# Patient Record
Sex: Female | Born: 1975 | Race: White | Hispanic: No | Smoking: Current every day smoker
Health system: Southern US, Community
[De-identification: ages and names within clinical notes are randomized; demographics above are authoritative.]

## PROBLEM LIST (undated history)

## (undated) DIAGNOSIS — F329 Major depressive disorder, single episode, unspecified: Secondary | ICD-10-CM

## (undated) DIAGNOSIS — F419 Anxiety disorder, unspecified: Secondary | ICD-10-CM

## (undated) DIAGNOSIS — F32A Depression, unspecified: Secondary | ICD-10-CM

## (undated) DIAGNOSIS — R928 Other abnormal and inconclusive findings on diagnostic imaging of breast: Secondary | ICD-10-CM

## (undated) DIAGNOSIS — Z22322 Carrier or suspected carrier of Methicillin resistant Staphylococcus aureus: Secondary | ICD-10-CM

## (undated) DIAGNOSIS — K219 Gastro-esophageal reflux disease without esophagitis: Secondary | ICD-10-CM

## (undated) DIAGNOSIS — S52502A Unspecified fracture of the lower end of left radius, initial encounter for closed fracture: Secondary | ICD-10-CM

## (undated) DIAGNOSIS — A4902 Methicillin resistant Staphylococcus aureus infection, unspecified site: Secondary | ICD-10-CM

## (undated) HISTORY — PX: WISDOM TOOTH EXTRACTION: SHX21

## (undated) HISTORY — DX: Carrier or suspected carrier of methicillin resistant Staphylococcus aureus: Z22.322

---

## 1998-11-28 ENCOUNTER — Emergency Department (HOSPITAL_COMMUNITY): Admission: EM | Admit: 1998-11-28 | Discharge: 1998-11-28 | Payer: Self-pay

## 1999-03-27 ENCOUNTER — Other Ambulatory Visit: Admission: RE | Admit: 1999-03-27 | Discharge: 1999-03-27 | Payer: Self-pay | Admitting: Obstetrics and Gynecology

## 1999-11-06 ENCOUNTER — Other Ambulatory Visit: Admission: RE | Admit: 1999-11-06 | Discharge: 1999-11-06 | Payer: Self-pay | Admitting: Obstetrics and Gynecology

## 2002-07-20 ENCOUNTER — Other Ambulatory Visit: Admission: RE | Admit: 2002-07-20 | Discharge: 2002-07-20 | Payer: Self-pay | Admitting: Obstetrics and Gynecology

## 2003-07-31 ENCOUNTER — Other Ambulatory Visit: Admission: RE | Admit: 2003-07-31 | Discharge: 2003-07-31 | Payer: Self-pay | Admitting: Obstetrics and Gynecology

## 2004-12-23 ENCOUNTER — Other Ambulatory Visit: Admission: RE | Admit: 2004-12-23 | Discharge: 2004-12-23 | Payer: Self-pay | Admitting: Obstetrics and Gynecology

## 2005-05-29 ENCOUNTER — Ambulatory Visit: Payer: Self-pay | Admitting: Internal Medicine

## 2006-01-16 ENCOUNTER — Other Ambulatory Visit: Admission: RE | Admit: 2006-01-16 | Discharge: 2006-01-16 | Payer: Self-pay | Admitting: Obstetrics and Gynecology

## 2006-08-18 ENCOUNTER — Other Ambulatory Visit: Admission: RE | Admit: 2006-08-18 | Discharge: 2006-08-18 | Payer: Self-pay | Admitting: Obstetrics and Gynecology

## 2007-06-03 ENCOUNTER — Ambulatory Visit: Payer: Self-pay | Admitting: Internal Medicine

## 2007-06-03 LAB — CONVERTED CEMR LAB
ALT: 14 units/L (ref 0–40)
AST: 18 units/L (ref 0–37)
Albumin: 3.8 g/dL (ref 3.5–5.2)
Alkaline Phosphatase: 28 units/L — ABNORMAL LOW (ref 39–117)
BUN: 5 mg/dL — ABNORMAL LOW (ref 6–23)
Basophils Absolute: 0 10*3/uL (ref 0.0–0.1)
Basophils Relative: 0.3 % (ref 0.0–1.0)
Bilirubin Urine: NEGATIVE
Bilirubin, Direct: 0.1 mg/dL (ref 0.0–0.3)
CO2: 26 meq/L (ref 19–32)
Calcium: 9 mg/dL (ref 8.4–10.5)
Chloride: 112 meq/L (ref 96–112)
Cholesterol: 233 mg/dL (ref 0–200)
Creatinine, Ser: 0.7 mg/dL (ref 0.4–1.2)
Crystals: NEGATIVE
Direct LDL: 127.4 mg/dL
Eosinophils Absolute: 0.3 10*3/uL (ref 0.0–0.6)
Eosinophils Relative: 4.4 % (ref 0.0–5.0)
GFR calc Af Amer: 126 mL/min
GFR calc non Af Amer: 104 mL/min
Glucose, Bld: 92 mg/dL (ref 70–99)
HCT: 38.9 % (ref 36.0–46.0)
HDL: 64.4 mg/dL (ref >39.0)
Hemoglobin: 13.4 g/dL (ref 12.0–15.0)
Ketones, ur: NEGATIVE mg/dL
Leukocytes, UA: NEGATIVE
Lymphocytes Relative: 46.1 % — ABNORMAL HIGH (ref 12.0–46.0)
MCHC: 34.5 g/dL (ref 30.0–36.0)
MCV: 90.6 fL (ref 78.0–100.0)
Monocytes Absolute: 0.4 10*3/uL (ref 0.2–0.7)
Monocytes Relative: 7.4 % (ref 3.0–11.0)
Neutro Abs: 2.5 10*3/uL (ref 1.4–7.7)
Neutrophils Relative %: 41.8 % — ABNORMAL LOW (ref 43.0–77.0)
Nitrite: NEGATIVE
Platelets: 284 10*3/uL (ref 150–400)
Potassium: 4.2 meq/L (ref 3.5–5.1)
RBC: 4.3 M/uL (ref 3.87–5.11)
RDW: 11.3 % — ABNORMAL LOW (ref 11.5–14.6)
Sodium: 140 meq/L (ref 135–145)
Specific Gravity, Urine: >=1.03 (ref 1.000–1.03)
TSH: 1.75 microintl units/mL (ref 0.35–5.50)
Total Bilirubin: 0.6 mg/dL (ref 0.3–1.2)
Total CHOL/HDL Ratio: 3.6
Total Protein: 6.7 g/dL (ref 6.0–8.3)
Triglycerides: 156 mg/dL — ABNORMAL HIGH (ref 0–149)
Urine Glucose: NEGATIVE mg/dL
Urobilinogen, UA: 0.2 (ref 0.0–1.0)
VLDL: 31 mg/dL (ref 0–40)
WBC: 6 10*3/uL (ref 4.5–10.5)
pH: 6 (ref 5.0–8.0)

## 2007-06-10 ENCOUNTER — Ambulatory Visit: Payer: Self-pay | Admitting: Internal Medicine

## 2007-11-18 ENCOUNTER — Ambulatory Visit: Payer: Self-pay | Admitting: Internal Medicine

## 2007-11-18 DIAGNOSIS — S46819A Strain of other muscles, fascia and tendons at shoulder and upper arm level, unspecified arm, initial encounter: Secondary | ICD-10-CM

## 2007-11-18 DIAGNOSIS — J45909 Unspecified asthma, uncomplicated: Secondary | ICD-10-CM | POA: Insufficient documentation

## 2007-11-18 DIAGNOSIS — F329 Major depressive disorder, single episode, unspecified: Secondary | ICD-10-CM

## 2007-11-18 DIAGNOSIS — E785 Hyperlipidemia, unspecified: Secondary | ICD-10-CM

## 2007-11-18 DIAGNOSIS — K219 Gastro-esophageal reflux disease without esophagitis: Secondary | ICD-10-CM

## 2007-11-18 DIAGNOSIS — S43499A Other sprain of unspecified shoulder joint, initial encounter: Secondary | ICD-10-CM

## 2007-11-18 DIAGNOSIS — J309 Allergic rhinitis, unspecified: Secondary | ICD-10-CM | POA: Insufficient documentation

## 2007-11-26 ENCOUNTER — Telehealth (INDEPENDENT_AMBULATORY_CARE_PROVIDER_SITE_OTHER): Payer: Self-pay | Admitting: *Deleted

## 2007-12-21 ENCOUNTER — Telehealth: Payer: Self-pay | Admitting: Internal Medicine

## 2008-04-03 ENCOUNTER — Ambulatory Visit: Payer: Self-pay | Admitting: Internal Medicine

## 2008-04-03 ENCOUNTER — Encounter (INDEPENDENT_AMBULATORY_CARE_PROVIDER_SITE_OTHER): Payer: Self-pay | Admitting: *Deleted

## 2008-04-03 DIAGNOSIS — J209 Acute bronchitis, unspecified: Secondary | ICD-10-CM

## 2008-04-07 ENCOUNTER — Telehealth (INDEPENDENT_AMBULATORY_CARE_PROVIDER_SITE_OTHER): Payer: Self-pay | Admitting: *Deleted

## 2008-04-18 ENCOUNTER — Ambulatory Visit: Payer: Self-pay | Admitting: Internal Medicine

## 2008-04-18 LAB — CONVERTED CEMR LAB
Basophils Absolute: 0.1 10*3/uL (ref 0.0–0.1)
Basophils Relative: 1 % (ref 0.0–1.0)
Eosinophils Absolute: 0.4 10*3/uL (ref 0.0–0.7)
Eosinophils Relative: 5.4 % — ABNORMAL HIGH (ref 0.0–5.0)
HCT: 39.4 % (ref 36.0–46.0)
Hemoglobin: 13.7 g/dL (ref 12.0–15.0)
IgE (Immunoglobulin E), Serum: 171.5 intl units/mL (ref 0.0–180.0)
Lymphocytes Relative: 38.6 % (ref 12.0–46.0)
MCHC: 34.7 g/dL (ref 30.0–36.0)
MCV: 91.2 fL (ref 78.0–100.0)
Monocytes Absolute: 0.4 10*3/uL (ref 0.1–1.0)
Monocytes Relative: 5 % (ref 3.0–12.0)
Neutro Abs: 3.5 10*3/uL (ref 1.4–7.7)
Neutrophils Relative %: 50 % (ref 43.0–77.0)
Platelets: 287 10*3/uL (ref 150–400)
RBC: 4.32 M/uL (ref 3.87–5.11)
RDW: 11.4 % — ABNORMAL LOW (ref 11.5–14.6)
WBC: 7.1 10*3/uL (ref 4.5–10.5)

## 2008-04-23 DIAGNOSIS — I10 Essential (primary) hypertension: Secondary | ICD-10-CM | POA: Insufficient documentation

## 2008-05-22 ENCOUNTER — Encounter: Payer: Self-pay | Admitting: Internal Medicine

## 2008-05-22 ENCOUNTER — Ambulatory Visit: Payer: Self-pay | Admitting: Internal Medicine

## 2009-10-29 ENCOUNTER — Telehealth: Payer: Self-pay | Admitting: Internal Medicine

## 2010-12-15 NOTE — L&D Delivery Note (Signed)
Delivery Note Pt progressed to complete and pushed well.  At 8:43 PM a viable and healthy female was delivered via Vaginal, Spontaneous Delivery (Presentation: Left Occiput Anterior).  APGAR: 9, 9; weight 7 lb 7 oz (3374 g).   Placenta status: Intact, Spontaneous.  Cord: 3 vessels with the following complications: None.  Anesthesia: Epidural  Episiotomy: None Lacerations: 2nd degree;Perineal Suture Repair: 3.0 vicryl Est. Blood Loss (mL): 400  Mom to postpartum.  Baby to nursery-stable.  Stephaun Million D 10/14/2011, 9:20 PM

## 2011-02-18 ENCOUNTER — Ambulatory Visit: Payer: Self-pay | Admitting: Internal Medicine

## 2011-03-28 LAB — ABO/RH: RH Type: POSITIVE

## 2011-03-28 LAB — ANTIBODY SCREEN: Antibody Screen: NEGATIVE

## 2011-03-28 LAB — HEPATITIS B SURFACE ANTIGEN: Hepatitis B Surface Ag: NEGATIVE

## 2011-05-27 DIAGNOSIS — J45909 Unspecified asthma, uncomplicated: Secondary | ICD-10-CM

## 2011-06-26 ENCOUNTER — Other Ambulatory Visit (HOSPITAL_COMMUNITY): Payer: Self-pay | Admitting: Obstetrics and Gynecology

## 2011-06-26 DIAGNOSIS — Z3689 Encounter for other specified antenatal screening: Secondary | ICD-10-CM

## 2011-07-03 ENCOUNTER — Encounter (HOSPITAL_COMMUNITY): Payer: Self-pay

## 2011-07-04 ENCOUNTER — Ambulatory Visit (HOSPITAL_COMMUNITY)
Admission: RE | Admit: 2011-07-04 | Discharge: 2011-07-04 | Disposition: A | Discharge status: Home / Self Care | Payer: Medicaid Other | Source: Ambulatory Visit | Attending: Obstetrics and Gynecology | Admitting: Obstetrics and Gynecology

## 2011-07-04 DIAGNOSIS — O09519 Supervision of elderly primigravida, unspecified trimester: Secondary | ICD-10-CM | POA: Insufficient documentation

## 2011-07-04 DIAGNOSIS — O358XX0 Maternal care for other (suspected) fetal abnormality and damage, not applicable or unspecified: Secondary | ICD-10-CM | POA: Insufficient documentation

## 2011-07-04 DIAGNOSIS — Z3689 Encounter for other specified antenatal screening: Secondary | ICD-10-CM

## 2011-07-06 ENCOUNTER — Inpatient Hospital Stay (HOSPITAL_COMMUNITY)
Admission: AD | Admit: 2011-07-06 | Discharge: 2011-07-07 | Disposition: A | Discharge status: Home Health | Payer: Medicaid Other | Source: Ambulatory Visit | Attending: Obstetrics and Gynecology | Admitting: Obstetrics and Gynecology

## 2011-07-06 DIAGNOSIS — Z3490 Encounter for supervision of normal pregnancy, unspecified, unspecified trimester: Secondary | ICD-10-CM

## 2011-07-06 DIAGNOSIS — O9989 Other specified diseases and conditions complicating pregnancy, childbirth and the puerperium: Secondary | ICD-10-CM | POA: Insufficient documentation

## 2011-07-06 DIAGNOSIS — R0602 Shortness of breath: Secondary | ICD-10-CM | POA: Insufficient documentation

## 2011-07-06 DIAGNOSIS — J45901 Unspecified asthma with (acute) exacerbation: Secondary | ICD-10-CM | POA: Insufficient documentation

## 2011-07-07 ENCOUNTER — Encounter (HOSPITAL_COMMUNITY): Payer: Self-pay | Admitting: Advanced Practice Midwife

## 2011-07-07 MED ORDER — NEBULIZER/TUBING/MOUTHPIECE KIT: PACK | Status: DC

## 2011-07-07 MED ORDER — IPRATROPIUM BROMIDE 0.02 % IN SOLN
RESPIRATORY_TRACT | Status: AC
  Filled 2011-07-07: qty 2.5

## 2011-07-07 MED ORDER — LEVALBUTEROL HCL 0.63 MG/3ML IN NEBU: 1.0000 | INHALATION_SOLUTION | RESPIRATORY_TRACT | Status: DC | PRN

## 2011-07-07 MED ORDER — ALBUTEROL SULFATE (5 MG/ML) 0.5% IN NEBU
INHALATION_SOLUTION | RESPIRATORY_TRACT | Status: AC
  Filled 2011-07-07: qty 0.5

## 2011-07-07 MED ORDER — ALBUTEROL SULFATE (5 MG/ML) 0.5% IN NEBU
5.0000 mg | INHALATION_SOLUTION | RESPIRATORY_TRACT | Status: DC
  Filled 2011-07-07 (×9): qty 1

## 2011-07-07 MED ORDER — IPRATROPIUM BROMIDE 0.02 % IN SOLN: 0.5000 mg | RESPIRATORY_TRACT | Status: DC

## 2011-07-07 MED ORDER — LEVALBUTEROL TARTRATE 45 MCG/ACT IN AERO: 1.0000 | INHALATION_SPRAY | RESPIRATORY_TRACT | Status: DC | PRN

## 2011-07-07 NOTE — Progress Notes (Signed)
Pt discharged for home-2nd reviewer of FHT tracing-N.Frazier,CNM

## 2011-07-07 NOTE — Progress Notes (Signed)
Doing some home remodeling and asthma has caused sob, tightness in chest.  Came in for a breathing treatment.  [redacted]wks pregnant. g1p0

## 2011-07-07 NOTE — ED Provider Notes (Signed)
History     Chief Complaint  Patient presents with  . Shortness of Breath   HPI 35 y.o.G1P0 at [redacted]w[redacted]d c/o asthma attack at home tonight. Husband is doing some renovations and stirred up a lot of dust today. + h/o asthma. Had episode of severe SOB this evening, does not have inhaler at home, has been a long time since last asthma attack. Started feeling better on the way here, no SOB or wheezing now. + fetal movement.     Past Medical History  Diagnosis Date  . Asthma     No past surgical history on file.  No family history on file.  History  Substance Use Topics  . Smoking status: Former Games developer  . Smokeless tobacco: Not on file  . Alcohol Use:     Allergies:  Allergies  Allergen Reactions  . Carrot Flavor Other (See Comments)    unknown    No prescriptions prior to admission    Review of Systems  HENT: Negative.   Respiratory: Positive for shortness of breath (now resolved) and wheezing (now resolved).   Cardiovascular: Negative.   Gastrointestinal: Negative.   Genitourinary: Negative.   Musculoskeletal: Negative.   Neurological: Negative.   Psychiatric/Behavioral: Negative.    Physical Exam   Blood pressure 116/70, pulse 88, temperature 98.1 F (36.7 C), temperature source Oral, resp. rate 20, height 5\' 4"  (1.626 m), weight 93.985 kg (207 lb 3.2 oz), last menstrual period 01/15/2011, SpO2 98.00%.  Physical Exam  Nursing note and vitals reviewed. Constitutional: She is oriented to person, place, and time. She appears well-developed and well-nourished. No distress.  Cardiovascular: Normal rate, regular rhythm and normal heart sounds.   Respiratory: Effort normal and breath sounds normal. No respiratory distress. She has no wheezes. She has no rales.  Musculoskeletal: Normal range of motion.  Neurological: She is alert and oriented to person, place, and time.  Skin: Skin is warm and dry.  Psychiatric: She has a normal mood and affect.    MAU Course    Procedures  Dr. Ellyn Hack aware pt was coming to MAU, ordered breathing treatment and rx for inhaler for home. However, upon arrival, pt states sx have improved, lungs clear. Declines breathing treatment. Monitored, pt stable, plan to d/c home with rx for inhaler and albuterol nebs PRN.   Assessment and Plan  35 y.o.G1P0 at [redacted]w[redacted]d with asthma exacerbation Rx Xopenex inhaler and nebulizer, instructed patient on use Pt to return if asthma sx return and does not get relief with meds   FRAZIER,NATALIE 07/07/2011, 2:58 AM

## 2011-07-07 NOTE — Progress Notes (Signed)
Pt relaxed with no signs of resp distress-states the SOB has resolved now that she is away from the house-they are remodling and the dust set her off

## 2011-10-14 ENCOUNTER — Encounter (HOSPITAL_COMMUNITY): Payer: Self-pay | Admitting: *Deleted

## 2011-10-14 ENCOUNTER — Inpatient Hospital Stay (HOSPITAL_COMMUNITY)
Admission: AD | Admit: 2011-10-14 | Discharge: 2011-10-16 | DRG: 775 | Disposition: A | Discharge status: Home / Self Care | Payer: Medicaid Other | Source: Ambulatory Visit | Attending: Obstetrics and Gynecology | Admitting: Obstetrics and Gynecology

## 2011-10-14 ENCOUNTER — Inpatient Hospital Stay (HOSPITAL_COMMUNITY): Payer: Medicaid Other | Admitting: Anesthesiology

## 2011-10-14 ENCOUNTER — Encounter (HOSPITAL_COMMUNITY): Payer: Self-pay | Admitting: Anesthesiology

## 2011-10-14 ENCOUNTER — Other Ambulatory Visit: Payer: Self-pay | Admitting: Obstetrics and Gynecology

## 2011-10-14 LAB — CBC
Hemoglobin: 12.3 g/dL (ref 12.0–15.0)
Platelets: 245 10*3/uL (ref 150–400)
RBC: 4.16 MIL/uL (ref 3.87–5.11)
WBC: 9.1 10*3/uL (ref 4.0–10.5)

## 2011-10-14 LAB — RPR: RPR Ser Ql: NONREACTIVE

## 2011-10-14 MED ORDER — ONDANSETRON HCL 4 MG/2ML IJ SOLN: 4.0000 mg | Freq: Four times a day (QID) | INTRAMUSCULAR | Status: DC | PRN

## 2011-10-14 MED ORDER — IBUPROFEN 600 MG PO TABS
600.0000 mg | ORAL_TABLET | Freq: Four times a day (QID) | ORAL | Status: DC | PRN
  Administered 2011-10-14: 600 mg via ORAL
  Filled 2011-10-14: qty 1

## 2011-10-14 MED ORDER — LIDOCAINE HCL 1.5 % IJ SOLN
INTRAMUSCULAR | Status: DC | PRN
  Administered 2011-10-14 (×2): 5 mL via EPIDURAL

## 2011-10-14 MED ORDER — FENTANYL 2.5 MCG/ML BUPIVACAINE 1/10 % EPIDURAL INFUSION (WH - ANES)
14.0000 mL/h | INTRAMUSCULAR | Status: DC
  Administered 2011-10-14: 14 mL/h via EPIDURAL
  Filled 2011-10-14 (×2): qty 60

## 2011-10-14 MED ORDER — FENTANYL 2.5 MCG/ML BUPIVACAINE 1/10 % EPIDURAL INFUSION (WH - ANES)
INTRAMUSCULAR | Status: DC | PRN
  Administered 2011-10-14: 14 mL/h via EPIDURAL

## 2011-10-14 MED ORDER — LACTATED RINGERS IV SOLN: 500.0000 mL | Freq: Once | INTRAVENOUS | Status: DC

## 2011-10-14 MED ORDER — EPHEDRINE 5 MG/ML INJ
10.0000 mg | INTRAVENOUS | Status: DC | PRN
  Filled 2011-10-14 (×2): qty 4

## 2011-10-14 MED ORDER — OXYTOCIN 20 UNITS IN LACTATED RINGERS INFUSION - SIMPLE
125.0000 mL/h | Freq: Once | INTRAVENOUS | Status: AC
  Administered 2011-10-14: 125 mL/h via INTRAVENOUS

## 2011-10-14 MED ORDER — OXYCODONE-ACETAMINOPHEN 5-325 MG PO TABS: 2.0000 | ORAL_TABLET | ORAL | Status: DC | PRN

## 2011-10-14 MED ORDER — PENICILLIN G POTASSIUM 5000000 UNITS IJ SOLR
2.5000 10*6.[IU] | INTRAVENOUS | Status: DC
  Administered 2011-10-14 (×2): 2.5 10*6.[IU] via INTRAVENOUS
  Filled 2011-10-14 (×7): qty 2.5

## 2011-10-14 MED ORDER — BUTORPHANOL TARTRATE 2 MG/ML IJ SOLN
INTRAMUSCULAR | Status: AC
  Administered 2011-10-14: 1 mg via INTRAVENOUS
  Filled 2011-10-14: qty 1

## 2011-10-14 MED ORDER — TERBUTALINE SULFATE 1 MG/ML IJ SOLN: 0.2500 mg | Freq: Once | INTRAMUSCULAR | Status: AC | PRN

## 2011-10-14 MED ORDER — LIDOCAINE HCL (PF) 1 % IJ SOLN
30.0000 mL | INTRAMUSCULAR | Status: DC | PRN
  Administered 2011-10-14: 30 mL via SUBCUTANEOUS
  Filled 2011-10-14: qty 30

## 2011-10-14 MED ORDER — CITRIC ACID-SODIUM CITRATE 334-500 MG/5ML PO SOLN: 30.0000 mL | ORAL | Status: DC | PRN

## 2011-10-14 MED ORDER — LACTATED RINGERS IV SOLN: 500.0000 mL | INTRAVENOUS | Status: DC | PRN

## 2011-10-14 MED ORDER — PENICILLIN G POTASSIUM 5000000 UNITS IJ SOLR
5.0000 10*6.[IU] | Freq: Once | INTRAVENOUS | Status: AC
  Administered 2011-10-14: 5 10*6.[IU] via INTRAVENOUS
  Filled 2011-10-14: qty 5

## 2011-10-14 MED ORDER — EPHEDRINE 5 MG/ML INJ
10.0000 mg | INTRAVENOUS | Status: DC | PRN
  Filled 2011-10-14: qty 4

## 2011-10-14 MED ORDER — PHENYLEPHRINE 40 MCG/ML (10ML) SYRINGE FOR IV PUSH (FOR BLOOD PRESSURE SUPPORT)
80.0000 ug | PREFILLED_SYRINGE | INTRAVENOUS | Status: DC | PRN
  Filled 2011-10-14: qty 5

## 2011-10-14 MED ORDER — ACETAMINOPHEN 325 MG PO TABS: 650.0000 mg | ORAL_TABLET | ORAL | Status: DC | PRN

## 2011-10-14 MED ORDER — BUTORPHANOL TARTRATE 2 MG/ML IJ SOLN
1.0000 mg | Freq: Once | INTRAMUSCULAR | Status: AC
  Administered 2011-10-14: 1 mg via INTRAVENOUS

## 2011-10-14 MED ORDER — LACTATED RINGERS IV SOLN
INTRAVENOUS | Status: DC
  Administered 2011-10-14 (×3): via INTRAVENOUS

## 2011-10-14 MED ORDER — OXYTOCIN 20 UNITS IN LACTATED RINGERS INFUSION - SIMPLE
1.0000 m[IU]/min | INTRAVENOUS | Status: DC
  Administered 2011-10-14: 1 m[IU]/min via INTRAVENOUS
  Filled 2011-10-14 (×2): qty 1000

## 2011-10-14 MED ORDER — PHENYLEPHRINE 40 MCG/ML (10ML) SYRINGE FOR IV PUSH (FOR BLOOD PRESSURE SUPPORT)
80.0000 ug | PREFILLED_SYRINGE | INTRAVENOUS | Status: DC | PRN
  Filled 2011-10-14 (×2): qty 5

## 2011-10-14 MED ORDER — OXYTOCIN BOLUS FROM INFUSION
500.0000 mL | Freq: Once | INTRAVENOUS | Status: DC
  Filled 2011-10-14: qty 500

## 2011-10-14 MED ORDER — DIPHENHYDRAMINE HCL 50 MG/ML IJ SOLN: 12.5000 mg | INTRAMUSCULAR | Status: DC | PRN

## 2011-10-14 NOTE — Anesthesia Preprocedure Evaluation (Signed)
Anesthesia Evaluation  Patient identified by MRN, date of birth, ID band Patient awake  General Assessment Comment  Reviewed: Allergy & Precautions, H&P , NPO status , Patient's Chart, lab work & pertinent test results  Airway Mallampati: II TM Distance: >3 FB Neck ROM: full    Dental No notable dental hx.    Pulmonary asthma  clear to auscultation  Pulmonary exam normal       Cardiovascular hypertension,     Neuro/Psych Negative Neurological ROS  Negative Psych ROS   GI/Hepatic negative GI ROS Neg liver ROS  GERD Medicated and Controlled  Endo/Other  Negative Endocrine ROS  Renal/GU negative Renal ROS  Genitourinary negative   Musculoskeletal negative musculoskeletal ROS (+)   Abdominal Normal abdominal exam  (+)   Peds negative pediatric ROS (+)  Hematology negative hematology ROS (+)   Anesthesia Other Findings   Reproductive/Obstetrics (+) Pregnancy                           Anesthesia Physical Anesthesia Plan  ASA: II  Anesthesia Plan: Epidural   Post-op Pain Management:    Induction:   Airway Management Planned:   Additional Equipment:   Intra-op Plan:   Post-operative Plan:   Informed Consent: I have reviewed the patients History and Physical, chart, labs and discussed the procedure including the risks, benefits and alternatives for the proposed anesthesia with the patient or authorized representative who has indicated his/her understanding and acceptance.     Plan Discussed with:   Anesthesia Plan Comments:         Anesthesia Quick Evaluation

## 2011-10-14 NOTE — Anesthesia Procedure Notes (Signed)
Epidural Patient location during procedure: OB Start time: 10/14/2011 3:23 PM End time: 10/14/2011 3:31 PM Reason for block: procedure for pain  Staffing Anesthesiologist: Sandrea Hughs Performed by: anesthesiologist   Preanesthetic Checklist Completed: patient identified, site marked, surgical consent, pre-op evaluation, timeout performed, IV checked, risks and benefits discussed and monitors and equipment checked  Epidural Patient position: sitting Prep: site prepped and draped and DuraPrep Patient monitoring: continuous pulse ox and blood pressure Approach: midline Injection technique: LOR air  Needle:  Needle type: Tuohy  Needle gauge: 17 G Needle length: 9 cm Needle insertion depth: 7 cm Catheter type: closed end flexible Catheter size: 19 Gauge Catheter at skin depth: 12 cm Test dose: negative and 1.5% lidocaine  Assessment Sensory level: T10 Events: blood not aspirated, injection not painful, no injection resistance, negative IV test and no paresthesia

## 2011-10-14 NOTE — H&P (Signed)
Karen Michael is a 35 y.o. female, G1 P0, EGA 38+ weeks, seen in the office this am c/o leaking fluid off amd on since between 2-4 am today, irregular ctx.  Eval in office c/w ROM and early labor, admitted to L&D.  Prenatal care essentially uncomplicated, see prenatal records for complete history.   Maternal Medical History:  Reason for admission: Reason for admission: rupture of membranes.  Contractions: Frequency: irregular.    Fetal activity: Perceived fetal activity is decreased.    Prenatal complications: no prenatal complications   OB History    Grav Para Term Preterm Abortions TAB SAB Ect Mult Living   1              Past Medical History  Diagnosis Date  . Asthma     last used inhaler this summer   Past Surgical History  Procedure Date  . Wisdom tooth extraction    Family History: family history is not on file. Social History:  reports that she has quit smoking. She does not have any smokeless tobacco history on file. She reports that she does not drink alcohol or use illicit drugs.  Review of Systems  Respiratory: Negative.   Cardiovascular: Negative.     Dilation: 1 Effacement (%): 90 Station: -2 Exam by:: L. Lima, RN Blood pressure 118/79, pulse 73, temperature 98 F (36.7 C), temperature source Oral, resp. rate 20, height 5\' 5"  (1.651 m), weight 95.709 kg (211 lb), last menstrual period 01/15/2011. Maternal Exam:  Uterine Assessment: Contraction strength is moderate.  Contraction frequency is irregular.   Abdomen: Patient reports no abdominal tenderness. Estimated fetal weight is 8 lbs.   Fetal presentation: vertex  Introitus: Normal vulva. Normal vagina.    Fetal Exam Fetal Monitor Review: Mode: ultrasound.   Baseline rate: 130.  Variability: moderate (6-25 bpm).   Pattern: accelerations present and no decelerations.    Fetal State Assessment: Category I - tracings are normal.     Physical Exam  Constitutional: She appears well-developed and  well-nourished.  Neck: Neck supple. No thyromegaly present.  Cardiovascular: Normal rate, regular rhythm and normal heart sounds.   No murmur heard. Respiratory: Breath sounds normal. No respiratory distress. She has no wheezes.  GI: Soft.    Prenatal labs: ABO, Rh: A/Positive/-- (04/13 0000) Antibody: Negative (04/13 0000) Rubella: Immune (04/13 0000) RPR: Nonreactive (04/13 0000)  HBsAg: Negative (04/13 0000)  HIV: Non-reactive (04/13 0000)  GBS: Positive (10/30 0000)   Assessment/Plan: IUP at 38+ weeks with SROM in early labor, GBS pos.  On pitocin augmentation, on PCN for GBS, monitor progress.   Karen Michael D 10/14/2011, 1:04 PM

## 2011-10-14 NOTE — Progress Notes (Signed)
Comfortable with epidural Afeb, VSS FHT- Cat I, ctx q 3 min VE 7cm per RN Continue pitocin and PCN, monitor progress

## 2011-10-15 MED ORDER — WITCH HAZEL-GLYCERIN EX PADS: 1.0000 "application " | MEDICATED_PAD | CUTANEOUS | Status: DC | PRN

## 2011-10-15 MED ORDER — MAGNESIUM HYDROXIDE 400 MG/5ML PO SUSP: 30.0000 mL | ORAL | Status: DC | PRN

## 2011-10-15 MED ORDER — DIPHENHYDRAMINE HCL 25 MG PO CAPS: 50.0000 mg | ORAL_CAPSULE | Freq: Every evening | ORAL | Status: DC | PRN

## 2011-10-15 MED ORDER — BENZOCAINE-MENTHOL 20-0.5 % EX AERO: 1.0000 "application " | INHALATION_SPRAY | CUTANEOUS | Status: DC | PRN

## 2011-10-15 MED ORDER — LANOLIN HYDROUS EX OINT: TOPICAL_OINTMENT | CUTANEOUS | Status: DC | PRN

## 2011-10-15 MED ORDER — IBUPROFEN 600 MG PO TABS
600.0000 mg | ORAL_TABLET | Freq: Four times a day (QID) | ORAL | Status: DC
  Administered 2011-10-15 – 2011-10-16 (×5): 600 mg via ORAL
  Filled 2011-10-15 (×5): qty 1

## 2011-10-15 MED ORDER — ONDANSETRON HCL 4 MG/2ML IJ SOLN: 4.0000 mg | INTRAMUSCULAR | Status: DC | PRN

## 2011-10-15 MED ORDER — LEVALBUTEROL TARTRATE 45 MCG/ACT IN AERO: 1.0000 | INHALATION_SPRAY | Freq: Three times a day (TID) | RESPIRATORY_TRACT | Status: DC | PRN

## 2011-10-15 MED ORDER — DIBUCAINE 1 % RE OINT: 1.0000 "application " | TOPICAL_OINTMENT | RECTAL | Status: DC | PRN

## 2011-10-15 MED ORDER — MEASLES, MUMPS & RUBELLA VAC ~~LOC~~ INJ: 0.5000 mL | INJECTION | Freq: Once | SUBCUTANEOUS | Status: DC

## 2011-10-15 MED ORDER — ONDANSETRON HCL 4 MG PO TABS: 4.0000 mg | ORAL_TABLET | ORAL | Status: DC | PRN

## 2011-10-15 MED ORDER — OXYCODONE-ACETAMINOPHEN 5-325 MG PO TABS
1.0000 | ORAL_TABLET | ORAL | Status: DC | PRN
  Administered 2011-10-15 – 2011-10-16 (×2): 1 via ORAL
  Filled 2011-10-15 (×2): qty 1

## 2011-10-15 MED ORDER — TETANUS-DIPHTH-ACELL PERTUSSIS 5-2.5-18.5 LF-MCG/0.5 IM SUSP
0.5000 mL | Freq: Once | INTRAMUSCULAR | Status: DC
  Filled 2011-10-15: qty 0.5

## 2011-10-15 MED ORDER — PRENATAL PLUS 27-1 MG PO TABS
1.0000 | ORAL_TABLET | Freq: Every day | ORAL | Status: DC
  Administered 2011-10-15 – 2011-10-16 (×2): 1 via ORAL
  Filled 2011-10-15 (×2): qty 1

## 2011-10-15 MED ORDER — SENNOSIDES-DOCUSATE SODIUM 8.6-50 MG PO TABS
2.0000 | ORAL_TABLET | Freq: Every day | ORAL | Status: DC
  Administered 2011-10-16: 2 via ORAL

## 2011-10-15 MED ORDER — OXYTOCIN 20 UNITS IN LACTATED RINGERS INFUSION - SIMPLE: 125.0000 mL/h | INTRAVENOUS | Status: DC | PRN

## 2011-10-15 MED ORDER — METHYLERGONOVINE MALEATE 0.2 MG PO TABS
0.2000 mg | ORAL_TABLET | ORAL | Status: DC | PRN
Start: 2011-10-15 — End: 2011-10-16

## 2011-10-15 MED ORDER — LORATADINE 10 MG PO TABS
10.0000 mg | ORAL_TABLET | Freq: Every day | ORAL | Status: DC
  Filled 2011-10-15 (×2): qty 1

## 2011-10-15 MED ORDER — SIMETHICONE 80 MG PO CHEW: 80.0000 mg | CHEWABLE_TABLET | ORAL | Status: DC | PRN

## 2011-10-15 MED ORDER — METHYLERGONOVINE MALEATE 0.2 MG/ML IJ SOLN: 0.2000 mg | INTRAMUSCULAR | Status: DC | PRN

## 2011-10-15 MED ORDER — ZOLPIDEM TARTRATE 5 MG PO TABS: 5.0000 mg | ORAL_TABLET | Freq: Every evening | ORAL | Status: DC | PRN

## 2011-10-15 NOTE — Progress Notes (Signed)

## 2011-10-15 NOTE — Addendum Note (Signed)
Addendum  created 10/15/11 1331 by Suella Grove   Modules edited:Charges VN, Notes Section

## 2011-10-15 NOTE — Progress Notes (Signed)
PPD#1 No problems Afeb, VSS Fundus- firm, NT at U-1 Continue routine care

## 2011-10-15 NOTE — Anesthesia Postprocedure Evaluation (Signed)
  Anesthesia Post-op Note  Patient: Karen Michael  Procedure(s) Performed: * No procedures listed *  Patient Location: PACU and Mother/Baby  Anesthesia Type: Epidural  Level of Consciousness: awake, alert  and oriented  Airway and Oxygen Therapy: Patient Spontanous Breathing  Post-op Pain: mild  Post-op Assessment: Post-op Vital signs reviewed  Post-op Vital Signs: Reviewed and stable  Complications: No apparent anesthesia complications

## 2011-10-15 NOTE — Progress Notes (Signed)
UR chart review completed.  

## 2011-10-15 NOTE — Anesthesia Postprocedure Evaluation (Signed)
Anesthesia Post Note  Patient: Karen Michael  Procedure(s) Performed: * No procedures listed *  Anesthesia type: Epidural  Patient location: Mother/Baby  Post pain: Pain level controlled  Post assessment: Post-op Vital signs reviewed  Last Vitals:  Filed Vitals:   10/15/11 0435  BP: 108/72  Pulse: 78  Temp: 36.7 C  Resp: 18    Post vital signs: Reviewed  Level of consciousness: awake  Complications: No apparent anesthesia complications

## 2011-10-16 MED ORDER — IBUPROFEN 600 MG PO TABS: 600.0000 mg | ORAL_TABLET | Freq: Four times a day (QID) | ORAL | Status: AC

## 2011-10-16 MED ORDER — OXYCODONE-ACETAMINOPHEN 5-325 MG PO TABS: 1.0000 | ORAL_TABLET | ORAL | Status: AC | PRN

## 2011-10-16 NOTE — Progress Notes (Signed)
PPD #2 Doing well Afeb, VSS D/c home 

## 2011-10-16 NOTE — Discharge Summary (Signed)
Obstetric Discharge Summary Reason for Admission: rupture of membranes Prenatal Procedures: none Intrapartum Procedures: spontaneous vaginal delivery Postpartum Procedures: none Complications-Operative and Postpartum: 2nd degree perineal laceration Hemoglobin  Date Value Range Status  10/14/2011 12.3  12.0-15.0 (g/dL) Final     HCT  Date Value Range Status  10/14/2011 36.3  36.0-46.0 (%) Final    Discharge Diagnoses: Term Pregnancy-delivered  Discharge Information: Date: 10/16/2011 Activity: pelvic rest Diet: routine Medications: Ibuprofen and Percocet Condition: stable Instructions: refer to practice specific booklet Discharge to: home Follow-up Information    Follow up with Zoran Yankee D, MD. Make an appointment in 6 weeks.   Contact information:   [address], [address] [place] [address] [phone]          Newborn Data: Live born female  Birth Weight: 7 lb 7 oz (3374 g) APGAR: 9, 9  Home with mother.  Eilan Mcinerny D 10/16/2011, 8:05 AM

## 2011-10-21 ENCOUNTER — Ambulatory Visit (HOSPITAL_COMMUNITY)
Admit: 2011-10-21 | Discharge: 2011-10-21 | Disposition: A | Discharge status: Home / Self Care | Payer: Medicaid Other | Attending: Obstetrics and Gynecology | Admitting: Obstetrics and Gynecology

## 2011-10-21 NOTE — Progress Notes (Signed)
Infant Lactation Consultation Outpatient Visit Note  Patient Name: Karen Michael Date of Birth: 02/22/1976 Birth Weight:  7 lbs 7 oz  Gestational Age at Delivery: Gestational Age: <None>38 6/7 weeks Type of Delivery: Vaginal  Breastfeeding History Frequency of Breastfeeding: every 3 hrs. Length of Feeding: 20 mins per breast Voids:  Stools: changing to yellow, liquid and >3 per day   Comments: Baby initially lost 13 oz (>10%) 6 lbs 10 oz on day 4, compounded with jaundice (bili 14).  Without any supplementation, baby increased to 6 lbs 15 oz after milk in.  Mom using a medium (24mm) nipple shield which was initiated in the hospital for difficult latch/soreness.   Consultation Evaluation:  Initial Feeding Assessment: Pre-feed Weight: 3232 gm Post-feed Weight: 3246 gm Amount Transferred: 14 ml from left breast (without nipple shield)  Comments:  with help attaining a deep latch.  Baby fed 20 mins.   Additional Feeding Assessment: Pre-feed Weight: 3246 gm Post-feed Weight: 3280 gm Amount Transferred: 34 ml from right breast (with nipple shield) Comments:  Baby latches very well, and deeply with the use of the nipple shield.   Total Breast milk Transferred this Visit: 48 mls   Additional Interventions: Talked and reassured mother about using the nipple shield.  Explained and demonstrated how her nipple inverts slightly when breast shaped for latch.  This makes is difficult for baby to latch deep enough.  Breasts are large, heavy, and dense, and as baby grows and gets stronger, she will be more likely to be able to latch adequately. Recommended she watch the baby for nutritive nursing rather than the clock for when to switch to second breast.  We also talked about waking baby for a feeding after 3 hours, but letting her feed more often if she shows feeding cues.  She slept 8 hrs a couple nights ago, and we talked about the affects of jaundice and breastfeeding.  Follow-Up  To  call us prn for any questions or for another feeding assessment    [name] 10/21/2011, 3:00 PM

## 2012-02-06 ENCOUNTER — Ambulatory Visit: Payer: Medicaid Other | Admitting: Physical Therapy

## 2012-02-11 ENCOUNTER — Ambulatory Visit: Payer: Medicaid Other | Attending: Orthopaedic Surgery | Admitting: Rehabilitative and Restorative Service Providers"

## 2012-02-11 DIAGNOSIS — M25569 Pain in unspecified knee: Secondary | ICD-10-CM | POA: Insufficient documentation

## 2012-02-11 DIAGNOSIS — M25579 Pain in unspecified ankle and joints of unspecified foot: Secondary | ICD-10-CM | POA: Insufficient documentation

## 2012-02-11 DIAGNOSIS — IMO0001 Reserved for inherently not codable concepts without codable children: Secondary | ICD-10-CM | POA: Insufficient documentation

## 2012-02-20 ENCOUNTER — Ambulatory Visit: Payer: Medicaid Other | Attending: Orthopaedic Surgery | Admitting: Physical Therapy

## 2012-02-20 DIAGNOSIS — IMO0001 Reserved for inherently not codable concepts without codable children: Secondary | ICD-10-CM | POA: Insufficient documentation

## 2012-02-20 DIAGNOSIS — M25569 Pain in unspecified knee: Secondary | ICD-10-CM | POA: Insufficient documentation

## 2012-02-20 DIAGNOSIS — M25579 Pain in unspecified ankle and joints of unspecified foot: Secondary | ICD-10-CM | POA: Insufficient documentation

## 2012-02-27 ENCOUNTER — Encounter: Payer: Medicaid Other | Admitting: Physical Therapy

## 2012-03-05 ENCOUNTER — Ambulatory Visit: Payer: Medicaid Other | Admitting: Physical Therapy

## 2012-03-16 ENCOUNTER — Ambulatory Visit: Payer: Medicaid Other | Attending: Orthopaedic Surgery | Admitting: Rehabilitative and Restorative Service Providers"

## 2012-03-16 DIAGNOSIS — M25579 Pain in unspecified ankle and joints of unspecified foot: Secondary | ICD-10-CM | POA: Insufficient documentation

## 2012-03-16 DIAGNOSIS — IMO0001 Reserved for inherently not codable concepts without codable children: Secondary | ICD-10-CM | POA: Insufficient documentation

## 2012-03-16 DIAGNOSIS — M25569 Pain in unspecified knee: Secondary | ICD-10-CM | POA: Insufficient documentation

## 2014-10-16 ENCOUNTER — Encounter (HOSPITAL_COMMUNITY): Payer: Self-pay | Admitting: *Deleted

## 2016-02-21 ENCOUNTER — Other Ambulatory Visit: Payer: Self-pay | Admitting: Obstetrics and Gynecology

## 2016-02-21 DIAGNOSIS — R928 Other abnormal and inconclusive findings on diagnostic imaging of breast: Secondary | ICD-10-CM

## 2016-02-28 ENCOUNTER — Ambulatory Visit
Admission: RE | Admit: 2016-02-28 | Discharge: 2016-02-28 | Disposition: A | Discharge status: Home / Self Care | Payer: BLUE CROSS/BLUE SHIELD | Source: Ambulatory Visit | Attending: Obstetrics and Gynecology | Admitting: Obstetrics and Gynecology

## 2016-02-28 DIAGNOSIS — R928 Other abnormal and inconclusive findings on diagnostic imaging of breast: Secondary | ICD-10-CM

## 2018-02-12 ENCOUNTER — Encounter (HOSPITAL_BASED_OUTPATIENT_CLINIC_OR_DEPARTMENT_OTHER): Payer: Self-pay | Admitting: *Deleted

## 2018-02-12 ENCOUNTER — Other Ambulatory Visit: Payer: Self-pay

## 2018-02-12 NOTE — H&P (Signed)
PREOPERATIVE H&P  Chief Complaint: left distal redius fracture  HPI: Karen Michael is a 42 y.o. female who presents for preoperative history and physical with a diagnosis of left distal redius fracture. Symptoms are rated as moderate to severe, and have been worsening.  This is significantly impairing activities of daily living.  She has elected for surgical management.   Past Medical History:  Diagnosis Date  . Anxiety   . Asthma    last used inhaler this summer  . Distal radius fracture, left   . GERD (gastroesophageal reflux disease)    OTC as needed   Past Surgical History:  Procedure Laterality Date  . WISDOM TOOTH EXTRACTION     Social History   Socioeconomic History  . Marital status: Single    Spouse name: None  . Number of children: None  . Years of education: None  . Highest education level: None  Social Needs  . Financial resource strain: None  . Food insecurity - worry: None  . Food insecurity - inability: None  . Transportation needs - medical: None  . Transportation needs - non-medical: None  Occupational History  . None  Tobacco Use  . Smoking status: Current Every Day Smoker    Packs/day: 1.00    Types: Cigarettes  . Smokeless tobacco: Never Used  Substance and Sexual Activity  . Alcohol use: Yes    Comment: social  . Drug use: No  . Sexual activity: Yes    Birth control/protection: IUD  Other Topics Concern  . None  Social History Narrative  . None   History reviewed. No pertinent family history. Allergies  Allergen Reactions  . Carrot Flavor Other (See Comments)    unknown   Prior to Admission medications   Medication Sig Start Date End Date Taking? Authorizing Provider  HYDROcodone-acetaminophen (NORCO/VICODIN) 5-325 MG tablet Take 1 tablet by mouth every 6 (six) hours as needed for moderate pain.   Yes [provider]  levonorgestrel (MIRENA) 20 MCG/24HR IUD 1 each by Intrauterine route once.   Yes [provider]      Positive ROS: All other systems have been reviewed and were otherwise negative with the exception of those mentioned in the HPI and as above.  Physical Exam: General: Alert, no acute distress Cardiovascular: No pedal edema Respiratory: No cyanosis, no use of accessory musculature GI: No organomegaly, abdomen is soft and non-tender Skin: No lesions in the area of chief complaint Neurologic: Sensation intact distally Psychiatric: Patient is competent for consent with normal mood and affect Lymphatic: No axillary or cervical lymphadenopathy  MUSCULOSKELETAL: LUE: splint CDI, distal motor and sensory preserved.  WWP hand.    Assessment: left distal redius fracture  Plan: Plan for Procedure(s): OPEN REDUCTION INTERNAL FIXATION (ORIF) LEFT DISTAL RADIAL FRACTURE  The risks benefits and alternatives were discussed with the patient including but not limited to the risks of nonoperative treatment, versus surgical intervention including infection, bleeding, nerve injury,  blood clots, cardiopulmonary complications, morbidity, mortality, among others, and they were willing to proceed.   Hiram Gash, MD  02/12/2018 7:40 PM

## 2018-02-15 ENCOUNTER — Ambulatory Visit (HOSPITAL_BASED_OUTPATIENT_CLINIC_OR_DEPARTMENT_OTHER): Payer: Self-pay | Admitting: Certified Registered"

## 2018-02-15 ENCOUNTER — Ambulatory Visit (HOSPITAL_BASED_OUTPATIENT_CLINIC_OR_DEPARTMENT_OTHER)
Admission: RE | Admit: 2018-02-15 | Discharge: 2018-02-15 | Disposition: A | Discharge status: Home / Self Care | Payer: Self-pay | Source: Ambulatory Visit | Attending: Orthopaedic Surgery | Admitting: Orthopaedic Surgery

## 2018-02-15 ENCOUNTER — Encounter (HOSPITAL_BASED_OUTPATIENT_CLINIC_OR_DEPARTMENT_OTHER)
Admission: RE | Disposition: A | Discharge status: Home / Self Care | Payer: Self-pay | Source: Ambulatory Visit | Attending: Orthopaedic Surgery

## 2018-02-15 ENCOUNTER — Encounter (HOSPITAL_BASED_OUTPATIENT_CLINIC_OR_DEPARTMENT_OTHER): Payer: Self-pay | Admitting: Certified Registered"

## 2018-02-15 ENCOUNTER — Other Ambulatory Visit: Payer: Self-pay

## 2018-02-15 DIAGNOSIS — K219 Gastro-esophageal reflux disease without esophagitis: Secondary | ICD-10-CM | POA: Insufficient documentation

## 2018-02-15 DIAGNOSIS — I10 Essential (primary) hypertension: Secondary | ICD-10-CM | POA: Insufficient documentation

## 2018-02-15 DIAGNOSIS — F419 Anxiety disorder, unspecified: Secondary | ICD-10-CM | POA: Insufficient documentation

## 2018-02-15 DIAGNOSIS — J45909 Unspecified asthma, uncomplicated: Secondary | ICD-10-CM | POA: Insufficient documentation

## 2018-02-15 DIAGNOSIS — F17210 Nicotine dependence, cigarettes, uncomplicated: Secondary | ICD-10-CM | POA: Insufficient documentation

## 2018-02-15 DIAGNOSIS — S52502A Unspecified fracture of the lower end of left radius, initial encounter for closed fracture: Secondary | ICD-10-CM | POA: Insufficient documentation

## 2018-02-15 DIAGNOSIS — X58XXXA Exposure to other specified factors, initial encounter: Secondary | ICD-10-CM | POA: Insufficient documentation

## 2018-02-15 HISTORY — DX: Gastro-esophageal reflux disease without esophagitis: K21.9

## 2018-02-15 HISTORY — DX: Methicillin resistant Staphylococcus aureus infection, unspecified site: A49.02

## 2018-02-15 HISTORY — PX: OPEN REDUCTION INTERNAL FIXATION (ORIF) DISTAL RADIAL FRACTURE: SHX5989

## 2018-02-15 HISTORY — DX: Unspecified fracture of the lower end of left radius, initial encounter for closed fracture: S52.502A

## 2018-02-15 HISTORY — DX: Anxiety disorder, unspecified: F41.9

## 2018-02-15 SURGERY — OPEN REDUCTION INTERNAL FIXATION (ORIF) DISTAL RADIUS FRACTURE
Anesthesia: General | Site: Wrist | Laterality: Left

## 2018-02-15 MED ORDER — ONDANSETRON HCL 4 MG/2ML IJ SOLN
INTRAMUSCULAR | Status: DC | PRN
  Administered 2018-02-15: 4 mg via INTRAVENOUS

## 2018-02-15 MED ORDER — BUPIVACAINE HCL (PF) 0.5 % IJ SOLN
INTRAMUSCULAR | Status: AC
  Filled 2018-02-15: qty 30

## 2018-02-15 MED ORDER — PROMETHAZINE HCL 25 MG/ML IJ SOLN: 6.2500 mg | INTRAMUSCULAR | Status: DC | PRN

## 2018-02-15 MED ORDER — LIDOCAINE HCL (CARDIAC) 20 MG/ML IV SOLN
INTRAVENOUS | Status: DC | PRN
  Administered 2018-02-15: 60 mg via INTRAVENOUS

## 2018-02-15 MED ORDER — MIDAZOLAM HCL 2 MG/2ML IJ SOLN
1.0000 mg | INTRAMUSCULAR | Status: DC | PRN
  Administered 2018-02-15: 1 mg via INTRAVENOUS
  Administered 2018-02-15: 2 mg via INTRAVENOUS

## 2018-02-15 MED ORDER — FENTANYL CITRATE (PF) 100 MCG/2ML IJ SOLN
INTRAMUSCULAR | Status: AC
  Filled 2018-02-15: qty 2

## 2018-02-15 MED ORDER — CEFAZOLIN SODIUM-DEXTROSE 2-4 GM/100ML-% IV SOLN: 2.0000 g | INTRAVENOUS | Status: DC

## 2018-02-15 MED ORDER — MELOXICAM 7.5 MG PO TABS: 7.5000 mg | ORAL_TABLET | Freq: Every day | ORAL | 2 refills | Status: AC

## 2018-02-15 MED ORDER — ONDANSETRON HCL 4 MG PO TABS: 4.0000 mg | ORAL_TABLET | Freq: Three times a day (TID) | ORAL | 1 refills | Status: AC | PRN

## 2018-02-15 MED ORDER — MEPERIDINE HCL 25 MG/ML IJ SOLN: 6.2500 mg | INTRAMUSCULAR | Status: DC | PRN

## 2018-02-15 MED ORDER — CHLORHEXIDINE GLUCONATE 4 % EX LIQD: 60.0000 mL | Freq: Once | CUTANEOUS | Status: DC

## 2018-02-15 MED ORDER — ACETAMINOPHEN 500 MG PO TABS: 1000.0000 mg | ORAL_TABLET | Freq: Three times a day (TID) | ORAL | 0 refills | Status: AC

## 2018-02-15 MED ORDER — PROPOFOL 10 MG/ML IV BOLUS
INTRAVENOUS | Status: AC
  Filled 2018-02-15: qty 20

## 2018-02-15 MED ORDER — BUPIVACAINE-EPINEPHRINE (PF) 0.5% -1:200000 IJ SOLN
INTRAMUSCULAR | Status: DC | PRN
  Administered 2018-02-15: 30 mL via PERINEURAL

## 2018-02-15 MED ORDER — OXYCODONE HCL 5 MG PO TABS: ORAL_TABLET | ORAL | 0 refills | Status: AC

## 2018-02-15 MED ORDER — LACTATED RINGERS IV SOLN
INTRAVENOUS | Status: DC
  Administered 2018-02-15 (×2): via INTRAVENOUS

## 2018-02-15 MED ORDER — LACTATED RINGERS IV SOLN: INTRAVENOUS | Status: DC

## 2018-02-15 MED ORDER — HYDROMORPHONE HCL 1 MG/ML IJ SOLN: 0.2500 mg | INTRAMUSCULAR | Status: DC | PRN

## 2018-02-15 MED ORDER — DEXAMETHASONE SODIUM PHOSPHATE 10 MG/ML IJ SOLN
INTRAMUSCULAR | Status: DC | PRN
  Administered 2018-02-15: 10 mg via INTRAVENOUS

## 2018-02-15 MED ORDER — SCOPOLAMINE 1 MG/3DAYS TD PT72: 1.0000 | MEDICATED_PATCH | Freq: Once | TRANSDERMAL | Status: DC | PRN

## 2018-02-15 MED ORDER — MIDAZOLAM HCL 2 MG/2ML IJ SOLN
INTRAMUSCULAR | Status: AC
  Filled 2018-02-15: qty 2

## 2018-02-15 MED ORDER — PROPOFOL 10 MG/ML IV BOLUS
INTRAVENOUS | Status: DC | PRN
  Administered 2018-02-15: 160 mg via INTRAVENOUS

## 2018-02-15 MED ORDER — BUPIVACAINE HCL (PF) 0.25 % IJ SOLN
INTRAMUSCULAR | Status: AC
  Filled 2018-02-15: qty 30

## 2018-02-15 MED ORDER — FENTANYL CITRATE (PF) 100 MCG/2ML IJ SOLN
50.0000 ug | INTRAMUSCULAR | Status: DC | PRN
  Administered 2018-02-15: 50 ug via INTRAVENOUS

## 2018-02-15 MED ORDER — CEFAZOLIN SODIUM-DEXTROSE 2-4 GM/100ML-% IV SOLN
INTRAVENOUS | Status: AC
  Filled 2018-02-15: qty 100

## 2018-02-15 SURGICAL SUPPLY — 67 items
APL SKNCLS STERI-STRIP NONHPOA (GAUZE/BANDAGES/DRESSINGS) ×1
BANDAGE ACE 4X5 VEL STRL LF (GAUZE/BANDAGES/DRESSINGS) ×3 IMPLANT
BENZOIN TINCTURE PRP APPL 2/3 (GAUZE/BANDAGES/DRESSINGS) ×3 IMPLANT
BIT DRILL 2.2 SS TIBIAL (BIT) ×2 IMPLANT
BLADE SURG 15 STRL LF DISP TIS (BLADE) ×2 IMPLANT
BLADE SURG 15 STRL SS (BLADE) ×6
BNDG CMPR 9X4 STRL LF SNTH (GAUZE/BANDAGES/DRESSINGS) ×1
BNDG COHESIVE 4X5 TAN STRL (GAUZE/BANDAGES/DRESSINGS) ×3 IMPLANT
BNDG ESMARK 4X9 LF (GAUZE/BANDAGES/DRESSINGS) ×3 IMPLANT
CLOSURE WOUND 1/2 X4 (GAUZE/BANDAGES/DRESSINGS)
CORD BIPOLAR FORCEPS 12FT (ELECTRODE) ×3 IMPLANT
COVER BACK TABLE 60X90IN (DRAPES) ×2 IMPLANT
CUFF TOURNIQUET SINGLE 18IN (TOURNIQUET CUFF) ×2 IMPLANT
DECANTER SPIKE VIAL GLASS SM (MISCELLANEOUS) IMPLANT
DRAPE EXTREMITY T 121X128X90 (DRAPE) ×3 IMPLANT
DRAPE IMP U-DRAPE 54X76 (DRAPES) ×6 IMPLANT
DRAPE OEC MINIVIEW 54X84 (DRAPES) ×3 IMPLANT
DRAPE SURG 17X23 STRL (DRAPES) ×3 IMPLANT
ELECT REM PT RETURN 9FT ADLT (ELECTROSURGICAL) ×3
ELECTRODE REM PT RTRN 9FT ADLT (ELECTROSURGICAL) ×1 IMPLANT
GAUZE SPONGE 4X4 12PLY STRL (GAUZE/BANDAGES/DRESSINGS) ×3 IMPLANT
GAUZE XEROFORM 1X8 LF (GAUZE/BANDAGES/DRESSINGS) ×3 IMPLANT
GLOVE BIOGEL PI IND STRL 7.0 (GLOVE) IMPLANT
GLOVE BIOGEL PI IND STRL 8 (GLOVE) ×1 IMPLANT
GLOVE BIOGEL PI INDICATOR 7.0 (GLOVE) ×2
GLOVE BIOGEL PI INDICATOR 8 (GLOVE) ×2
GLOVE ECLIPSE 6.5 STRL STRAW (GLOVE) ×2 IMPLANT
GLOVE ECLIPSE 8.0 STRL XLNG CF (GLOVE) ×3 IMPLANT
GOWN STRL REUS W/ TWL LRG LVL3 (GOWN DISPOSABLE) ×1 IMPLANT
GOWN STRL REUS W/TWL LRG LVL3 (GOWN DISPOSABLE) ×3
GOWN STRL REUS W/TWL XL LVL3 (GOWN DISPOSABLE) ×3 IMPLANT
K-WIRE 1.6 (WIRE) ×3
K-WIRE FX5X1.6XNS BN SS (WIRE) ×1
KWIRE FX5X1.6XNS BN SS (WIRE) IMPLANT
NS IRRIG 1000ML POUR BTL (IV SOLUTION) ×3 IMPLANT
PACK BASIN DAY SURGERY FS (CUSTOM PROCEDURE TRAY) ×3 IMPLANT
PAD CAST 4YDX4 CTTN HI CHSV (CAST SUPPLIES) ×1 IMPLANT
PADDING CAST COTTON 4X4 STRL (CAST SUPPLIES) ×3
PEG LOCKING SMOOTH 2.2X18 (Peg) ×2 IMPLANT
PEG LOCKING SMOOTH 2.2X20 (Screw) ×6 IMPLANT
PENCIL BUTTON HOLSTER BLD 10FT (ELECTRODE) ×3 IMPLANT
PLATE NARROW DVR LEFT (Plate) ×2 IMPLANT
SCREW LOCK 14X2.7X 3 LD TPR (Screw) IMPLANT
SCREW LOCK 16X2.7X 3 LD TPR (Screw) IMPLANT
SCREW LOCKING 2.7X14 (Screw) ×9 IMPLANT
SCREW LOCKING 2.7X16 (Screw) ×3 IMPLANT
SLEEVE SCD COMPRESS KNEE MED (MISCELLANEOUS) ×2 IMPLANT
SLING ARM FOAM STRAP LRG (SOFTGOODS) ×3 IMPLANT
SLING ARM MED ADULT FOAM STRAP (SOFTGOODS) ×2 IMPLANT
SLING ARM XL FOAM STRAP (SOFTGOODS) IMPLANT
SPLINT FAST PLASTER 5X30 (CAST SUPPLIES) ×2
SPLINT PLASTER CAST FAST 5X30 (CAST SUPPLIES) ×1 IMPLANT
SPLINT PLASTER CAST XFAST 3X15 (CAST SUPPLIES) ×10 IMPLANT
SPLINT PLASTER XTRA FASTSET 3X (CAST SUPPLIES) ×20
STOCKINETTE 4X48 STRL (DRAPES) ×3 IMPLANT
STRIP CLOSURE SKIN 1/2X4 (GAUZE/BANDAGES/DRESSINGS) IMPLANT
SUCTION FRAZIER HANDLE 10FR (MISCELLANEOUS) ×2
SUCTION TUBE FRAZIER 10FR DISP (MISCELLANEOUS) ×1 IMPLANT
SUT MNCRL AB 4-0 PS2 18 (SUTURE) ×3 IMPLANT
SUT VIC AB 3-0 SH 27 (SUTURE) ×3
SUT VIC AB 3-0 SH 27X BRD (SUTURE) ×1 IMPLANT
SYR BULB 3OZ (MISCELLANEOUS) ×3 IMPLANT
TOWEL OR 17X24 6PK STRL BLUE (TOWEL DISPOSABLE) ×3 IMPLANT
TOWEL OR NON WOVEN STRL DISP B (DISPOSABLE) ×3 IMPLANT
TUBE CONNECTING 20'X1/4 (TUBING) ×1
TUBE CONNECTING 20X1/4 (TUBING) ×2 IMPLANT
YANKAUER SUCT BULB TIP NO VENT (SUCTIONS) IMPLANT

## 2018-02-15 NOTE — Discharge Instructions (Signed)

## 2018-02-15 NOTE — Op Note (Signed)
Orthopaedic Surgery Operative Note (CSN: [id number])  Karen Michael  03/04/1976 Date of Surgery: 02/15/2018   Diagnoses:  left distal radius  Procedures:   * OPEN REDUCTION INTERNAL FIXATION (ORIF) LEFT DISTAL RADIAL FRACTURE CPT 25609   Operative Finding Successful completion of planned procedure.  Good hemostasis at end of case with tourniquet deflated.  Post-operative plan: The patient will be nwb in splint for 2 weeks.  The patient will be dc home.  DVT prophylaxis not indicated in isolated upper extremity surgery patient with no specific risks factors.  Pain control with PRN pain medication preferring oral medicines.  Follow up plan will be scheduled in approximately 10-14 days for wound check and XR 2 view.  Post-Op Diagnosis: Same Surgeons:Primary: Hiram Gash, MD Assistants:Brandon Leslee Home OPA Location: [facility] OR ROOM 5 Anesthesia: Choice Antibiotics: Ancef 2g preop Tourniquet time:  Total Tourniquet Time Documented: Upper Arm (Left) - 24 minutes Total: Upper Arm (Left) - 24 minutes  Estimated Blood Loss: 5 Complications: None Specimens: None Implants: Implant Name Type Inv. Item Serial No. Manufacturer Lot No. LRB No. Used Action  PLATE NARROW DVR LEFT - [id number] Plate PLATE NARROW DVR LEFT  [employer](ORTH,TRAU,BIO,SG)  Left 1 Implanted  PEG LOCKING SMOOTH 2.2X18 - [id number] Peg PEG LOCKING SMOOTH 2.2X18  [employer](ORTH,TRAU,BIO,SG)  Left 1 Implanted  PEG LOCKING SMOOTH 2.2X20 - [id number] Screw PEG LOCKING SMOOTH 2.2X20  [employer](ORTH,TRAU,BIO,SG)  Left 3 Implanted  SCREW LOCKING 2.7X14 - [id number] Screw SCREW LOCKING 2.7X14  [employer](ORTH,TRAU,BIO,SG)  Left 3 Implanted  SCREW LOCKING 2.7X16 - [id number] Screw SCREW LOCKING 2.7X16  [employer](ORTH,TRAU,BIO,SG)  Left 1 Implanted    Indications for Surgery:   Karen Michael is a 42 y.o. female with fall resulting in above injury.  We initially tried non-op management but patient settled into additional  negative volar tilt and in this young healthy active patient though a smoker we discussed surgery. Benefits and risks of operative and nonoperative management were discussed prior to surgery with patient/guardian(s) and informed consent form was completed.  Specific risks including infection, need for additional surgery, infection, nonunion, hardware complications and neurovascular injury were discussed.   Procedure:   The patient was identified in the preoperative holding area where the surgical site was marked. The patient was taken to the OR where a procedural timeout was called and the above noted anesthesia was induced.  The patient was positioned supine on hand table.  Preoperative antibiotics were dosed.  The patient's left wrist was prepped and draped in the usual sterile fashion.  A second preoperative timeout was called.      A tourniquet was used for the above listed time.   An FCR approach was made exposing the volar surface of the distal radius taking care to go through the sheath of the FCR tendon tract and ulnarly exposing the inferior portion of the sheath while protecting the median nerve and radial artery on each side with blunt retractors.  This inferior portion of the sheath was incised sharply and examined for presence of the palmar cutaneous branch of the median nerve.  It was determined to not be within the field and we carried our dissection deeply to the bone splitting the pronator quadratus and exposing the fracture site.    Appropriate reduction was obtained and a narrow DVR Biomet plate was placed and checked for sizing and reduction under fluoroscopy.  We placed the plate and used the plate to obtain our reduction setting its location on fluoroscopy and filling  the distal holes once checking position.    Once appropriate reduction was confirmed we then proceeded to fill the distal holes with a combination of partially threaded screws and pegs.  Initially we used a  non-locking screw to pull the plate to bone.  At this point we checked our reduction to ensure that there was no intra-articular extension of our screws.  Once this was confirmed we proceeded to fill remaining 3 proximal shaft screws and obtained final images which demonstrated appropriate reduction and maintenance of alignment.  The DRUJ was checked and found to be stable.  We verified that all fast guides were removed on XR and through count.    The wound was thoroughly irrigated.  The tourniquet was released prior to skin closure to verify there was no excessive bleeding and we visualized that the radial artery and median nerve were intact at the end of the case. The PQ was reapproximated grossly prior to skin closure.     The incision was thoroughly irrigated and closed in a multilayer fashion.  A sterile dressing was placed.  A small demi splint volarly was placed.  The patient was awoken from general anesthesia and taken to the PACU in stable condition without complication.   Joya Gaskins, OPA-C, present and scrubbed throughout the case, critical for completion in a timely fashion, and for retraction, instrumentation, closure.

## 2018-02-15 NOTE — Progress Notes (Signed)
Assisted Dr. Hollis with left, ultrasound guided, supraclavicular block. Side rails up, monitors on throughout procedure. See vital signs in flow sheet. Tolerated Procedure well. 

## 2018-02-15 NOTE — Anesthesia Procedure Notes (Signed)
Anesthesia Regional Block: Supraclavicular block   Pre-Anesthetic Checklist: ,, timeout performed, Correct Patient, Correct Site, Correct Laterality, Correct Procedure, Correct Position, site marked, Risks and benefits discussed,  Surgical consent,  Pre-op evaluation,  At surgeon's request and post-op pain management  Laterality: Left  Prep: chloraprep       Needles:  Injection technique: Single-shot  Needle Type: Echogenic Needle     Needle Length: 9cm  Needle Gauge: 21     Additional Needles:   Procedures:,,,, ultrasound used (permanent image in chart),,,,  Narrative:  Start time: 02/15/2018 12:15 PM End time: 02/15/2018 12:25 PM Injection made incrementally with aspirations every 5 mL.  Performed by: Personally  Anesthesiologist: Effie Berkshire, MD  Additional Notes: Patient tolerated the procedure well. Local anesthetic introduced in an incremental fashion under minimal resistance after negative aspirations. No paresthesias were elicited. After completion of the procedure, no acute issues were identified and patient continued to be monitored by RN.

## 2018-02-15 NOTE — Anesthesia Procedure Notes (Signed)
Procedure Name: LMA Insertion Date/Time: 02/15/2018 1:02 PM Performed by: Maryella Shivers, CRNA Pre-anesthesia Checklist: Patient identified, Emergency Drugs available, Suction available and Patient being monitored Patient Re-evaluated:Patient Re-evaluated prior to induction Oxygen Delivery Method: Circle system utilized Preoxygenation: Pre-oxygenation with 100% oxygen Induction Type: IV induction Ventilation: Mask ventilation without difficulty LMA: LMA inserted LMA Size: 4.0 Number of attempts: 1 Airway Equipment and Method: Bite block Placement Confirmation: positive ETCO2 Tube secured with: Tape Dental Injury: Teeth and Oropharynx as per pre-operative assessment

## 2018-02-15 NOTE — Anesthesia Postprocedure Evaluation (Signed)
Anesthesia Post Note  Patient: Karen Michael  Procedure(s) Performed: OPEN REDUCTION INTERNAL FIXATION (ORIF) LEFT DISTAL RADIAL FRACTURE (Left Wrist)     Patient location during evaluation: PACU Anesthesia Type: General and Regional Level of consciousness: awake and alert Pain management: pain level controlled Vital Signs Assessment: post-procedure vital signs reviewed and stable Respiratory status: spontaneous breathing, nonlabored ventilation, respiratory function stable and patient connected to nasal cannula oxygen Cardiovascular status: blood pressure returned to baseline and stable Postop Assessment: no apparent nausea or vomiting Anesthetic complications: no    Last Vitals:  Vitals:   02/15/18 1415 02/15/18 1445  BP: 122/72 132/89  Pulse: 82 68  Resp: 16 14  Temp:  36.7 C  SpO2: 96% 100%    Last Pain:  Vitals:   02/15/18 1445  TempSrc:   PainSc: 0-No pain                 Effie Berkshire

## 2018-02-15 NOTE — Interval H&P Note (Signed)
Discussed case, risks and benefits with patient again.  All questions answered, no change to history.  Delrick Dehart MD  

## 2018-02-15 NOTE — Transfer of Care (Signed)
Immediate Anesthesia Transfer of Care Note  Patient: Karen Michael  Procedure(s) Performed: OPEN REDUCTION INTERNAL FIXATION (ORIF) LEFT DISTAL RADIAL FRACTURE (Left Wrist)  Patient Location: PACU  Anesthesia Type:GA combined with regional for post-op pain  Level of Consciousness: awake, alert  and oriented  Airway & Oxygen Therapy: Patient Spontanous Breathing and Patient connected to face mask oxygen  Post-op Assessment: Report given to RN and Post -op Vital signs reviewed and stable  Post vital signs: Reviewed and stable  Last Vitals:  Vitals:   02/15/18 1235 02/15/18 1240  BP:    Pulse: 84 84  Resp: 13 19  Temp:    SpO2: 100% 100%    Last Pain:  Vitals:   02/15/18 1142  TempSrc: Oral  PainSc: 3       Patients Stated Pain Goal: 3 (02/12/30 4388)  Complications: No apparent anesthesia complications

## 2018-02-15 NOTE — Anesthesia Preprocedure Evaluation (Addendum)
Anesthesia Evaluation  Patient identified by MRN, date of birth, ID band Patient awake    Reviewed: Allergy & Precautions, NPO status , Patient's Chart, lab work & pertinent test results  Airway Mallampati: III  TM Distance: >3 FB Neck ROM: Full    Dental  (+) Teeth Intact, Dental Advisory Given   Pulmonary asthma , Current Smoker,    breath sounds clear to auscultation       Cardiovascular hypertension,  Rhythm:Regular Rate:Normal     Neuro/Psych PSYCHIATRIC DISORDERS Anxiety Depression    GI/Hepatic Neg liver ROS, GERD  ,  Endo/Other  negative endocrine ROS  Renal/GU negative Renal ROS     Musculoskeletal negative musculoskeletal ROS (+)   Abdominal Normal abdominal exam  (+)   Peds  Hematology negative hematology ROS (+)   Anesthesia Other Findings   Reproductive/Obstetrics                            Anesthesia Physical Anesthesia Plan  ASA: II  Anesthesia Plan: General   Post-op Pain Management: GA combined w/ Regional for post-op pain   Induction: Intravenous  PONV Risk Score and Plan: 3 and Ondansetron, Dexamethasone and Midazolam  Airway Management Planned: LMA  Additional Equipment: None  Intra-op Plan:   Post-operative Plan: Extubation in OR  Informed Consent:   Plan Discussed with: CRNA  Anesthesia Plan Comments:         Anesthesia Quick Evaluation

## 2018-02-16 ENCOUNTER — Encounter (HOSPITAL_BASED_OUTPATIENT_CLINIC_OR_DEPARTMENT_OTHER): Payer: Self-pay | Admitting: Orthopaedic Surgery

## 2018-03-16 ENCOUNTER — Other Ambulatory Visit: Payer: Self-pay

## 2018-03-16 ENCOUNTER — Ambulatory Visit: Payer: Self-pay | Attending: Orthopaedic Surgery | Admitting: Occupational Therapy

## 2018-03-16 DIAGNOSIS — M25542 Pain in joints of left hand: Secondary | ICD-10-CM | POA: Insufficient documentation

## 2018-03-16 DIAGNOSIS — M25632 Stiffness of left wrist, not elsewhere classified: Secondary | ICD-10-CM | POA: Insufficient documentation

## 2018-03-16 DIAGNOSIS — M25642 Stiffness of left hand, not elsewhere classified: Secondary | ICD-10-CM | POA: Insufficient documentation

## 2018-03-16 DIAGNOSIS — M25532 Pain in left wrist: Secondary | ICD-10-CM | POA: Insufficient documentation

## 2018-03-16 DIAGNOSIS — M6281 Muscle weakness (generalized): Secondary | ICD-10-CM | POA: Insufficient documentation

## 2018-03-16 DIAGNOSIS — R202 Paresthesia of skin: Secondary | ICD-10-CM | POA: Insufficient documentation

## 2018-03-16 DIAGNOSIS — R600 Localized edema: Secondary | ICD-10-CM | POA: Insufficient documentation

## 2018-03-16 NOTE — Therapy (Signed)
[facility] [address] [place], [place], [address] Phone: [phone]   Fax:  [phone]  Occupational Therapy Evaluation  Patient Details  Name: Karen Michael MRN: [id number] Date of Birth: 1976/08/21 Referring Provider: Dr. Griffin Basil   Encounter Date: 03/16/2018  OT End of Session - 03/16/18 1416    Visit Number  1    Number of Visits  17    Date for OT Re-Evaluation  05/16/18    Authorization Type  SELF PAY    OT Start Time  0800    OT Stop Time  0850    OT Time Calculation (min)  50 min    Activity Tolerance  Patient tolerated treatment well       Past Medical History:  Diagnosis Date  . Anxiety   . Asthma    last used inhaler this summer  . Distal radius fracture, left   . GERD (gastroesophageal reflux disease)    OTC as needed  . MRSA infection approx 6 years ago during breat feeding unsure which breast    Past Surgical History:  Procedure Laterality Date  . OPEN REDUCTION INTERNAL FIXATION (ORIF) DISTAL RADIAL FRACTURE Left 02/15/2018   Procedure: OPEN REDUCTION INTERNAL FIXATION (ORIF) LEFT DISTAL RADIAL FRACTURE;  Surgeon: Hiram Gash, MD;  Location: [facility];  Service: Orthopedics;  Laterality: Left;  . WISDOM TOOTH EXTRACTION      There were no vitals filed for this visit.  Subjective Assessment - 03/16/18 0806    Patient is accompained by:  Family member husband    Pertinent History  ORIF Lt distal radius fx 02/15/18.     Limitations  no P/ROM, no strengthening    Currently in Pain?  Yes    Pain Score  4     Pain Location  Wrist    Pain Orientation  Left    Pain Descriptors / Indicators  Aching;Throbbing;Shooting    Pain Type  Acute pain    Pain Onset  1 to 4 weeks ago    Pain Frequency  Constant fluctuates in intensity        [facility] OT Assessment - 03/16/18 0001      Assessment   Medical Diagnosis  ORIF Lt distal radius fx    Referring Provider  Dr. Griffin Basil    Onset Date/Surgical Date  02/15/18    Hand Dominance  Right    Next MD Visit  Today      Precautions   Precautions  Other (comment)    Precaution Comments  No wrist ROM, no strengthening, wrist brace on at all times except for hygiene care    Required Braces or Orthoses  Other Brace/Splint    Other Brace/Splint  Lt wrist brace (pre-fab provided by MD office)       Restrictions   Weight Bearing Restrictions  Yes    LUE Weight Bearing  Non weight bearing      Balance Screen   Has the patient fallen in the past 6 months  Yes    How many times?  1    Has the patient had a decrease in activity level because of a fear of falling?   No    Is the patient reluctant to leave their home because of a fear of falling?   No      Home  Environment   Lives With  Spouse and 74 y.o. 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      ADL   Eating/Feeding  Needs assist with cutting food    Grooming  Minimal assistance Needs assist to wash and style hair    Upper Body Bathing  Minimal assistance    Lower Body Bathing  Modified independent    Upper Body Dressing  -- Mod I    Lower Body Dressing  Modified independent slip on elastic pants, slip on shoes    Administrator, sports seat with back built in      [place] independently for small purchases    Light Housekeeping  Performs light daily tasks such as dishwashing, bed making    Meal Prep  -- needs assist    Investment banker, corporate own vehicle      Mobility   Mobility Status  Independent      Written Expression   Dominant Hand  Right      Observation/Other Assessments   Observations  Arrived with brace Lt wrist (from MD office)       Sensation   Additional Comments  Pt reports numbness Lt volar palm and wrist  and radial side of thumb. Pt reports fingertips intact      Edema   Edema  moderate Lt hand/wrist      ROM / Strength   AROM / PROM / Strength  AROM      AROM   Overall AROM Comments  RUE AROM WNL's. Lt shoulder and elbow ROM WFL's. Thumb opposition to middle finger. Wrist and forearm not assessed due to precautions. Composite fist approx 75% and composite ext approx 75% with brace on (lacking MP and PIP flex and MP ext)               OT Treatments/Exercises (OP) - 03/16/18 0001      Exercises   Exercises  -- See instructions for A/ROM HEP for fingers/thumb            OT Education - 03/16/18 0844    Education provided  Yes    Education Details  Finger and thumb HEP    Person(s) Educated  Patient;Spouse    Methods  Explanation;Demonstration;Handout    Comprehension  Verbalized understanding;Returned demonstration       OT Short Term Goals - 03/16/18 1441      OT SHORT TERM GOAL #1   Title  Independent with initial HEP     Time  4    Period  Weeks    Status  New    Target Date  04/15/18      OT SHORT TERM GOAL #2   Title  Lt Wrist ROM to be 25 degrees in flexion and extension    Baseline  unable to move until 6 weeks post-op    Time  4    Period  Weeks    Status  New      OT SHORT TERM GOAL #3   Title  Pt to make full fist Lt hand    Baseline  approx 75%     Time  4    Period  Weeks    Status  New      OT SHORT TERM GOAL #4   Title  Pt to id edema and pain management  strategies for Lt wrist and hand    Time  4    Period  Weeks    Status  New        OT Long Term Goals - 03/16/18 1443      OT LONG TERM GOAL #1   Title  Independent with updated HEP    Time  8    Period  Weeks    Status  New    Target Date  05/16/18      OT LONG TERM GOAL #2   Title  Pt to demo 45 degrees or greater wrist flexion and extension Lt wrist    Time  8    Period  Weeks    Status  New      OT LONG TERM GOAL #3   Title  Pt to demo 45 lbs grip strength Lt  hand to assist in opening jars    Time  8    Period  Weeks    Status  New      OT LONG TERM GOAL #4   Title  Pt to return to using Lt hand as assist for all bilateral tasks    Time  8    Period  Weeks    Status  New            Plan - 03/16/18 1422    Clinical Impression Statement  Pt is a 42 y.o. female who presents to outpatient rehab s/p ORIF Lt distal radius 02/15/18. Pt presents today with pre-fab wrist brace on (provided by MD office) s/p 4 weeks post-op. Pt also with moderate edema in fingers/hand, pain, and stiffness in fingers. Did not assess wrist ROM due to current precautions    Occupational Profile and client history currently impacting functional performance  No significant PMH but current limitations preventing pt from doing IADLS and work tasks    Occupational performance deficits (Please refer to evaluation for details):  IADL's;Work;Leisure;Social Participation    Rehab Potential  Good    Current Impairments/barriers affecting progress:  pain, edema, stiffness    OT Frequency  2x / week    OT Duration  8 weeks however, may only see 1x/wk due to pt self pay    OT Treatment/Interventions  Self-care/ADL training;Moist Heat;Fluidtherapy;DME and/or AE instruction;Splinting;Therapeutic activities;Compression bandaging;Therapeutic exercise;Ultrasound;Cryotherapy;Passive range of motion;Electrical Stimulation;Paraffin;Patient/family education;Manual Therapy    Plan  review finger A/ROM HEP, add P/ROM HEP to fingers, begin wrist ROM at 6 weeks post-op unless MD clears sooner    Consulted and Agree with Plan of Care  Patient;Family member/caregiver    Family Member Consulted  husband       Patient will benefit from skilled therapeutic intervention in order to improve the following deficits and impairments:  Decreased coordination, Decreased range of motion, Impaired flexibility, Increased edema, Impaired sensation, Decreased activity tolerance, Impaired UE functional use, Pain,  Decreased strength  Visit Diagnosis: Stiffness of left wrist, not elsewhere classified - Plan: Ot plan of care cert/re-cert  Stiffness of left hand, not elsewhere classified - Plan: Ot plan of care cert/re-cert  Pain in left wrist - Plan: Ot plan of care cert/re-cert  Pain in joints of left hand - Plan: Ot plan of care cert/re-cert  Localized edema - Plan: Ot plan of care cert/re-cert  Muscle weakness (generalized) - Plan: Ot plan of care cert/re-cert  Paresthesia of skin - Plan: Ot plan of care cert/re-cert    Problem List Patient Active Problem List   Diagnosis Date Noted  .  SVD (spontaneous vaginal delivery) 10/14/2011  . Asthma   . HYPERTENSION 04/23/2008  . BRONCHITIS, ACUTE 04/03/2008  . HYPERLIPIDEMIA 11/18/2007  . DEPRESSION 11/18/2007  . ALLERGIC RHINITIS 11/18/2007  . ASTHMA 11/18/2007  . GERD 11/18/2007  . SPRAIN&STRAIN OTH SPEC SITES SHOULDER&UPPER ARM 11/18/2007    Carey Bullocks, OTR/L 03/16/2018, 2:46 PM  [facility] [address] [place], [place], [address] Phone: [phone]   Fax:  [phone]  Name: Karen Michael MRN: [id number] Date of Birth: 15-Jan-1976

## 2018-03-16 NOTE — Patient Instructions (Signed)
   DO ALL BELOW EXERCISES WITH BRACE ON  1. MP Flexion (Active)    With back of hand on table, bend large knuckles as far as they will go, keeping small joints straight. Then straighten as far as possible Repeat __10-15__ times. Do __6__ sessions per day.  2. PIP Flexion (Active)    Keeping large knuckles straight, bend the middle joint and tip joints of ___all___ fingers, then straighten as far as possible. Hold __3__ seconds. Repeat __10-15__ times. Do _6___ sessions per day.  3.  AROM: Finger Flexion / Extension    Actively bend fingers of right hand. Slowly make a fist. Hold __3__ seconds. Relax. Then straighten fingers as far as possible. Repeat __10-15__ times per set.  Do _6___ sessions per day.  4. Opposition (Active)    Touch tip of thumb to nail tip of each finger in turn, making an "O" shape. Repeat _15___ times. Do _6___ sessions per day.

## 2018-03-24 ENCOUNTER — Ambulatory Visit: Payer: Self-pay | Admitting: Occupational Therapy

## 2018-03-24 DIAGNOSIS — M25642 Stiffness of left hand, not elsewhere classified: Secondary | ICD-10-CM

## 2018-03-24 DIAGNOSIS — M25532 Pain in left wrist: Secondary | ICD-10-CM

## 2018-03-24 DIAGNOSIS — M25542 Pain in joints of left hand: Secondary | ICD-10-CM

## 2018-03-24 DIAGNOSIS — M25632 Stiffness of left wrist, not elsewhere classified: Secondary | ICD-10-CM

## 2018-03-24 NOTE — Therapy (Signed)
[facility] [address] [place], [place], [address] Phone: [phone]   Fax:  [phone]  Occupational Therapy Treatment  Patient Details  Name: Karen Michael MRN: [id number] Date of Birth: 17-Sep-1976 Referring Provider: Dr. Griffin Basil   Encounter Date: 03/24/2018  OT End of Session - 03/24/18 1118    Visit Number  2    Number of Visits  17    Date for OT Re-Evaluation  05/16/18    Authorization Type  SELF PAY    OT Start Time  1020    OT Stop Time  1105    OT Time Calculation (min)  45 min    Activity Tolerance  Patient tolerated treatment well       Past Medical History:  Diagnosis Date  . Anxiety   . Asthma    last used inhaler this summer  . Distal radius fracture, left   . GERD (gastroesophageal reflux disease)    OTC as needed  . MRSA infection approx 6 years ago during breat feeding unsure which breast    Past Surgical History:  Procedure Laterality Date  . OPEN REDUCTION INTERNAL FIXATION (ORIF) DISTAL RADIAL FRACTURE Left 02/15/2018   Procedure: OPEN REDUCTION INTERNAL FIXATION (ORIF) LEFT DISTAL RADIAL FRACTURE;  Surgeon: Hiram Gash, MD;  Location: [facility];  Service: Orthopedics;  Laterality: Left;  . WISDOM TOOTH EXTRACTION      There were no vitals filed for this visit.  Subjective Assessment - 03/24/18 1031    Subjective   Per MD orders: protocol is fine, but he also wrote "work agressively on finger and wrist ROM now"     Pertinent History  ORIF Lt distal radius fx 02/15/18.     Limitations  no P/ROM, no strengthening    Currently in Pain?  Yes    Pain Score  3     Pain Location  Wrist    Pain Orientation  Left    Pain Descriptors / Indicators  Aching;Throbbing    Pain Type  Acute pain    Pain Onset  More than a month ago    Pain Frequency  Intermittent    Aggravating Factors   movement    Pain Relieving Factors  rest, pain meds         [facility] OT Assessment -  03/24/18 0001      AROM   Overall AROM Comments  Lt wrist flex = 35*, ext = 23*                OT Treatments/Exercises (OP) - 03/24/18 0001      ADLs   ADL Comments  Pt now 5 weeks and 2 days post-op. Pt returns with MD response: "enclosed protocol is fine, work aggressively on finger and wrist ROM now, desensitization as much as possible". Protocol does not call for wrist ROM until 6 weeks post-op however MD wants wrist ROM now. Therefore issued wrist A/ROM HEP today (see pt instructions for details). Also issued P/ROM HEP for fingers today. Pt issued and discussed/reviewed desensitization techniques and scar massage.       Exercises   Exercises  -- see pt instructions for details on HEP for wrist/forearm HEP             OT Education - 03/24/18 1102    Education provided  Yes    Education Details  P/ROM HEP for fingers, A/ROM HEP for wrist, desensitization techniques and scar massage    Person(s) Educated  Patient    Methods  Explanation;Demonstration;Handout    Comprehension  Verbalized understanding;Returned demonstration       OT Short Term Goals - 03/24/18 1119      OT SHORT TERM GOAL #1   Title  Independent with initial HEP     Time  4    Period  Weeks    Status  Achieved      OT SHORT TERM GOAL #2   Title  Lt Wrist ROM to be 25 degrees in flexion and extension    Baseline  unable to move until 6 weeks post-op    Time  4    Period  Weeks    Status  On-going      OT SHORT TERM GOAL #3   Title  Pt to make full fist Lt hand    Baseline  approx 75%     Time  4    Period  Weeks    Status  On-going      OT SHORT TERM GOAL #4   Title  Pt to id edema and pain management strategies for Lt wrist and hand    Time  4    Period  Weeks    Status  On-going        OT Long Term Goals - 03/16/18 1443      OT LONG TERM GOAL #1   Title  Independent with updated HEP    Time  8    Period  Weeks    Status  New    Target Date  05/16/18      OT LONG TERM  GOAL #2   Title  Pt to demo 45 degrees or greater wrist flexion and extension Lt wrist    Time  8    Period  Weeks    Status  New      OT LONG TERM GOAL #3   Title  Pt to demo 45 lbs grip strength Lt hand to assist in opening jars    Time  8    Period  Weeks    Status  New      OT LONG TERM GOAL #4   Title  Pt to return to using Lt hand as assist for all bilateral tasks    Time  8    Period  Weeks    Status  New            Plan - 03/24/18 1119    Clinical Impression Statement  Pt progressing towards goals. Pt is currently 5 weeks and 2 days post-op. Pt has greater understanding of desensitization techniques and scar massage.     Occupational Profile and client history currently impacting functional performance  No significant PMH but current limitations preventing pt from doing IADLS and work tasks    Rehab Potential  Good    Current Impairments/barriers affecting progress:  pain, edema, stiffness    OT Frequency  2x / week    OT Duration  8 weeks    OT Treatment/Interventions  Self-care/ADL training;Moist Heat;Fluidtherapy;DME and/or AE instruction;Splinting;Therapeutic activities;Compression bandaging;Therapeutic exercise;Ultrasound;Cryotherapy;Passive range of motion;Electrical Stimulation;Paraffin;Patient/family education;Manual Therapy    Plan  fluidotherapy, forearm gym, wrist winder (no weight), supinator roller, continue A/ROM to wrist and forearm    Consulted and Agree with Plan of Care  Patient       Patient will benefit from skilled therapeutic intervention in order to improve the following deficits and impairments:  Decreased coordination, Decreased range of motion, Impaired flexibility, Increased edema,  Impaired sensation, Decreased activity tolerance, Impaired UE functional use, Pain, Decreased strength  Visit Diagnosis: Stiffness of left wrist, not elsewhere classified  Stiffness of left hand, not elsewhere classified  Pain in left wrist  Pain in joints  of left hand    Problem List Patient Active Problem List   Diagnosis Date Noted  . SVD (spontaneous vaginal delivery) 10/14/2011  . Asthma   . HYPERTENSION 04/23/2008  . BRONCHITIS, ACUTE 04/03/2008  . HYPERLIPIDEMIA 11/18/2007  . DEPRESSION 11/18/2007  . ALLERGIC RHINITIS 11/18/2007  . ASTHMA 11/18/2007  . GERD 11/18/2007  . SPRAIN&STRAIN OTH SPEC SITES SHOULDER&UPPER ARM 11/18/2007    Carey Bullocks, OTR/L 03/24/2018, 11:21 AM  [facility] [address] [place], [place], [address] Phone: [phone]   Fax:  [phone]  Name: Karen Michael MRN: [id number] Date of Birth: June 18, 1976

## 2018-03-24 NOTE — Patient Instructions (Signed)
PROM: Finger MP Joints   Passively bend ____all____ finger of hand at big knuckle until stretch is felt. Hold _10___ seconds. Relax. Straighten finger as far as possible. Repeat __5__ times per set.  Do __4-6__ sessions per day.   PIP Flexion (Passive)   Use other hand to bend the middle joint of __all____ finger down as far as possible. Hold _10___ seconds. Repeat __5__ times. Do _4-6___ sessions per day.   AROM: Wrist Extension    With right palm down, bend wrist up. Repeat _10-15___ times per set. Do _1___ sets per session. Do _4-6___ sessions per day.  WRIST: Flexion Against Gravity    Rest arm and hand on surface, palm up. Raise wrist up. _10-15__ reps per set, _4-6__ sets per day,   AROM: Wrist Radial / Ulnar Deviation    Gently bend left wrist from side to side as far as possible. Repeat _10___ times per set. Do __1__ sets per session. Do _4-6___ sessions per day.  Combination Movement (Active)    Keep elbow firmly at side with wrist straight. Make lazy eights using palm up and down movements. Repeat _10___ times. Do _4-6___ sessions per day.    Desensitization Techniques  Perform these exercises 2x/day for 5 minute sessions.  Progress to the next exercises when the exercises you are doing become easy.  1)  Using light pressure, rub the various textures along with the hypersensitive area:  A.  Halfway House  C.  Wool  G.  Cotton material  D.  Terry cloth  2)  With the same textures use a firmer pressure. 3)  Use a hand held vibrator and massage along the sensitive area. 4)  With a small dowel rod, eraser on a pencil or base of an ink pen tap along the sensitive area. 5)  Use an empty roll-on deodorant bottle to roll along the sensitive area. 6)  Place your hand/forearm in separate containers of the following items:  A.  Sand  D.  Dry lentil beans  B.  Dry Rice  E.  Dry kidney beans  C.  Ball bearings F.  Dry pinto  beans  Scar Massage Purpose: To soften/smooth scar tissue.   To desensitize sensitive areas after surgery.   To mechanically break up inner scar tissue, adhesions, therefore allowing freer        movement of injured tendons and muscle.  Technique: Use a cream to massage with, as it insures a smooth gliding motion and avoids irritation caused by rubbing skin to skin.  Cream is preferred over a lotion.   May begin as soon as any suture areas are healed.   Apply a firm, steady pressure with your finger-tip, pulling the skin in a circular motion over the scarred area.  Do not rub.  Message 5 minutes, at least 2 times a day, unless you are getting tender afterwards

## 2018-04-01 ENCOUNTER — Ambulatory Visit: Payer: Self-pay | Admitting: Occupational Therapy

## 2018-04-01 DIAGNOSIS — R600 Localized edema: Secondary | ICD-10-CM

## 2018-04-01 DIAGNOSIS — M25532 Pain in left wrist: Secondary | ICD-10-CM

## 2018-04-01 DIAGNOSIS — M25542 Pain in joints of left hand: Secondary | ICD-10-CM

## 2018-04-01 DIAGNOSIS — M25632 Stiffness of left wrist, not elsewhere classified: Secondary | ICD-10-CM

## 2018-04-01 DIAGNOSIS — M6281 Muscle weakness (generalized): Secondary | ICD-10-CM

## 2018-04-01 DIAGNOSIS — M25642 Stiffness of left hand, not elsewhere classified: Secondary | ICD-10-CM

## 2018-04-01 NOTE — Patient Instructions (Addendum)
Composite Extension (Passive Flexor Stretch)    Sitting with elbows on table and palms together, slowly lower wrists toward table until stretch is felt. Be sure to keep palms together throughout stretch. Hold __10__ seconds. Relax. Repeat _5___ times. Do __4-6__ sessions per day.  Wrist Passive Flexion    Rest left forearm on table, hand palm-down over edge. Bend wrist by pressing hand down with other hand. Hold _10___ seconds. Repeat _5___ times. Do __4-6__ sessions per day.   Wrist Extension: Resisted    With left palm down, __2__ pound weight in hand, bend wrist up. Return slowly. Repeat _10___ times per set. Do __1__ sets per session. Do __3__ sessions per day.  Wrist Flexion: Resisted    With left palm up, __2__ pound weight in hand, bend wrist up. Return slowly. Repeat _10___ times per set. Do __1__ sets per session. Do __3__ sessions per day.    1. Grip Strengthening (Resistive Putty)   Squeeze putty using thumb and all fingers. Repeat _20___ times. Do _3_ sessions per day.   2. Roll putty into tube on table and pinch between each finger and thumb x 10 reps each. (Do ring and small finger together)     Copyright  VHI. All rights reserved.

## 2018-04-01 NOTE — Therapy (Signed)
[facility] [address] [place], [place], [address] Phone: [phone]   Fax:  [phone]  Occupational Therapy Treatment  Patient Details  Name: Karen Michael MRN: [id number] Date of Birth: [birth year] Referring Provider: Dr. Griffin Basil   Encounter Date: 04/01/2018  OT End of Session - 04/01/18 1203    Visit Number  3    Number of Visits  17    Date for OT Re-Evaluation  05/16/18    Authorization Type  SELF PAY    OT Start Time  0850    OT Stop Time  0935    OT Time Calculation (min)  45 min    Activity Tolerance  Patient tolerated treatment well       Past Medical History:  Diagnosis Date  . Anxiety   . Asthma    last used inhaler this summer  . Distal radius fracture, left   . GERD (gastroesophageal reflux disease)    OTC as needed  . MRSA infection approx 6 years ago during breat feeding unsure which breast    Past Surgical History:  Procedure Laterality Date  . OPEN REDUCTION INTERNAL FIXATION (ORIF) DISTAL RADIAL FRACTURE Left 02/15/2018   Procedure: OPEN REDUCTION INTERNAL FIXATION (ORIF) LEFT DISTAL RADIAL FRACTURE;  Surgeon: Hiram Gash, MD;  Location: [facility];  Service: Orthopedics;  Laterality: Left;  . WISDOM TOOTH EXTRACTION      There were no vitals filed for this visit.  Subjective Assessment - 04/01/18 0903    Subjective   I saw the doctor yesterday and he said I cannot hurt my wrist at this point. He said the Xray looked completely healed. He said to work aggressively and that I can lift up to 20-25 lbs. He said I have complex regional pain syndrome    Pertinent History  ORIF Lt distal radius fx 02/15/18.     Currently in Pain?  Yes    Pain Score  3     Pain Location  Wrist    Pain Orientation  Left    Pain Descriptors / Indicators  Aching;Throbbing    Pain Type  Acute pain    Pain Onset  More than a month ago    Pain Frequency  Intermittent    Aggravating Factors    movement    Pain Relieving Factors  rest, pain meds         [facility] OT Assessment - 04/01/18 0001      Hand Function   Right Hand Grip (lbs)  75 LBS    Left Hand Grip (lbs)  15 LBS               OT Treatments/Exercises (OP) - 04/01/18 0001      ADLs   ADL Comments  Pt now > 6 weeks post-op. Pt was instructed by MD to d/c splint and to work aggressively on wrist, that she cannot hurt it. MD also mentioned CRPS to pt. Therefore therapist issued P/ROM and strengthening HEP to wrist today.       Exercises   Exercises  Wrist;Hand      Wrist Exercises   Other wrist exercises  Forearm gym x 4 reps    Other wrist exercises  Pt issued P/ROM HEP and strengthening HEP for wrist - see pt instructions. Prayer stretch and wrist flex stretch x 5 reps each holding 10 sec. Wrist flexion and wrist extension each x 10 reps with 2 lb. weight  Hand Exercises   Other Hand Exercises  Pt issued putty HEP - see pt instructions. Pt issued yellow resistance putty for home use      Modalities   Modalities  Fluidotherapy      LUE Fluidotherapy   Number Minutes Fluidotherapy  12 Minutes    LUE Fluidotherapy Location  Hand;Wrist    Comments  at beginning of session for stiffness/pain and desensitization             OT Education - 04/01/18 0918    Education provided  Yes    Education Details  P/ROM and strengthening HEP for Lt wrist, putty HEP    Person(s) Educated  Patient    Methods  Explanation;Demonstration;Handout    Comprehension  Verbalized understanding;Returned demonstration       OT Short Term Goals - 03/24/18 1119      OT SHORT TERM GOAL #1   Title  Independent with initial HEP     Time  4    Period  Weeks    Status  Achieved      OT SHORT TERM GOAL #2   Title  Lt Wrist ROM to be 25 degrees in flexion and extension    Baseline  unable to move until 6 weeks post-op    Time  4    Period  Weeks    Status  On-going      OT SHORT TERM GOAL #3   Title  Pt to make  full fist Lt hand    Baseline  approx 75%     Time  4    Period  Weeks    Status  On-going      OT SHORT TERM GOAL #4   Title  Pt to id edema and pain management strategies for Lt wrist and hand    Time  4    Period  Weeks    Status  On-going        OT Long Term Goals - 04/01/18 1204      OT LONG TERM GOAL #1   Title  Independent with updated HEP    Time  8    Period  Weeks    Status  On-going      OT LONG TERM GOAL #2   Title  Pt to demo 45 degrees or greater wrist flexion and extension Lt wrist    Time  8    Period  Weeks    Status  New      OT LONG TERM GOAL #3   Title  Pt to demo 45 lbs grip strength Lt hand to assist in opening jars    Time  8    Period  Weeks    Status  New      OT LONG TERM GOAL #4   Title  Pt to return to using Lt hand as assist for all bilateral tasks    Time  8    Period  Weeks    Status  New            Plan - 04/01/18 1204    Clinical Impression Statement  Pt now greater than 6 weeks post-op. Began P/ROM and strengthening at wrist today based on pt's report of MD update from yesterday's visit with MD.     Occupational Profile and client history currently impacting functional performance  No significant PMH but current limitations preventing pt from doing IADLS and work tasks    Rehab Potential  Good  Current Impairments/barriers affecting progress:  pain, edema, stiffness    OT Frequency  2x / week    OT Duration  8 weeks    OT Treatment/Interventions  Self-care/ADL training;Moist Heat;Fluidtherapy;DME and/or AE instruction;Splinting;Therapeutic activities;Compression bandaging;Therapeutic exercise;Ultrasound;Cryotherapy;Passive range of motion;Electrical Stimulation;Paraffin;Patient/family education;Manual Therapy    Plan  continue fluido, assess remaining STG's, wrist winder, supinator roller, review previously issued HEP (P/ROM, strengthening, and putty), gripper activity. May also show pt scrubbing, walking with weight,  consider estim ?     Consulted and Agree with Plan of Care  Patient       Patient will benefit from skilled therapeutic intervention in order to improve the following deficits and impairments:  Decreased coordination, Decreased range of motion, Impaired flexibility, Increased edema, Impaired sensation, Decreased activity tolerance, Impaired UE functional use, Pain, Decreased strength  Visit Diagnosis: Stiffness of left wrist, not elsewhere classified  Stiffness of left hand, not elsewhere classified  Pain in left wrist  Pain in joints of left hand  Localized edema  Muscle weakness (generalized)    Problem List Patient Active Problem List   Diagnosis Date Noted  . SVD (spontaneous vaginal delivery) 10/14/2011  . Asthma   . HYPERTENSION 04/23/2008  . BRONCHITIS, ACUTE 04/03/2008  . HYPERLIPIDEMIA 11/18/2007  . DEPRESSION 11/18/2007  . ALLERGIC RHINITIS 11/18/2007  . ASTHMA 11/18/2007  . GERD 11/18/2007  . SPRAIN&STRAIN OTH SPEC SITES SHOULDER&UPPER ARM 11/18/2007    Carey Bullocks, OTR/L 04/01/2018, 12:08 PM  [facility] [address] [place], [place], [address] Phone: [phone]   Fax:  [phone]  Name: Karen Michael MRN: [id number] Date of Birth: July 02, 1976

## 2018-04-06 ENCOUNTER — Encounter: Payer: Self-pay | Admitting: *Deleted

## 2018-04-06 ENCOUNTER — Ambulatory Visit: Payer: Self-pay | Admitting: *Deleted

## 2018-04-06 DIAGNOSIS — M25542 Pain in joints of left hand: Secondary | ICD-10-CM

## 2018-04-06 DIAGNOSIS — M6281 Muscle weakness (generalized): Secondary | ICD-10-CM

## 2018-04-06 DIAGNOSIS — R600 Localized edema: Secondary | ICD-10-CM

## 2018-04-06 DIAGNOSIS — R202 Paresthesia of skin: Secondary | ICD-10-CM

## 2018-04-06 DIAGNOSIS — M25632 Stiffness of left wrist, not elsewhere classified: Secondary | ICD-10-CM

## 2018-04-06 DIAGNOSIS — M25642 Stiffness of left hand, not elsewhere classified: Secondary | ICD-10-CM

## 2018-04-06 DIAGNOSIS — M25532 Pain in left wrist: Secondary | ICD-10-CM

## 2018-04-06 NOTE — Therapy (Signed)
[facility] [address] [place], [place], [address] Phone: [phone]   Fax:  [phone]  Occupational Therapy Treatment  Patient Details  Name: Karen Michael MRN: [id number] Date of Birth: 1976/02/05 Referring Provider: Dr. Griffin Basil   Encounter Date: 04/06/2018  OT End of Session - 04/06/18 1133    Visit Number  4    Number of Visits  17    Date for OT Re-Evaluation  05/16/18    Authorization Type  SELF PAY    OT Start Time  1036    OT Stop Time  1124    OT Time Calculation (min)  48 min    Activity Tolerance  Patient tolerated treatment well    Behavior During Therapy  [facility] for tasks assessed/performed       Past Medical History:  Diagnosis Date  . Anxiety   . Asthma    last used inhaler this summer  . Distal radius fracture, left   . GERD (gastroesophageal reflux disease)    OTC as needed  . MRSA infection approx 6 years ago during breat feeding unsure which breast    Past Surgical History:  Procedure Laterality Date  . OPEN REDUCTION INTERNAL FIXATION (ORIF) DISTAL RADIAL FRACTURE Left 02/15/2018   Procedure: OPEN REDUCTION INTERNAL FIXATION (ORIF) LEFT DISTAL RADIAL FRACTURE;  Surgeon: Hiram Gash, MD;  Location: [facility];  Service: Orthopedics;  Laterality: Left;  . WISDOM TOOTH EXTRACTION      There were no vitals filed for this visit.  Subjective Assessment - 04/06/18 1055    Subjective   Pt reports that she feels as though her grip strength has improved. She feels limited in wrist active flexion and extension.    Pertinent History  ORIF Lt distal radius fx 02/15/18.     Currently in Pain?  No/denies    Pain Score  0-No pain    Multiple Pain Sites  No         [facility] OT Assessment - 04/06/18 0001      AROM   Overall AROM Comments  L wrist flex = 30*, exten = 26*      Hand Function   Left Hand Grip (lbs)  24 LBS               OT Treatments/Exercises (OP) -  04/06/18 0001      ADLs   ADL Comments  Pt reports that yesterday was the first day that she was able to not use her splint all day. She typed with out splint, did all ADL's and ex's.       Exercises   Exercises  Wrist;Hand      Wrist Exercises   Other wrist exercises  2# wt over forearm plinth and hold for wrist flexion, followed by wrist extension and hold x5 sec; scrubbing activity with L UE on table top and reviewed for scrubbing at wall & in shower (added to HEP) L UE; walking in gym with 2# wrist weight (exhagerrated arm movements x3 laps w/o c/o. Pt states "It actually feels better now"). Added to HEP as well.    Other wrist exercises  Pt issued P/ROM HEP and strengthening HEP for wrist - see pt instructions. Prayer stretch and wrist flex stretch x 5 reps each holding 10 sec. Wrist flexion and wrist extension each x 10 reps with 2 lb. weight      Hand Exercises   Other Hand Exercises  Reviewed/performed putty HEP, using yellow  resistance putty for home use. Pt is Mod I putty as seen in clinic today.      Modalities   Modalities  Fluidotherapy      LUE Fluidotherapy   Number Minutes Fluidotherapy  10 Minutes    LUE Fluidotherapy Location  Hand;Wrist    Comments  at beginning of session for stiffness/pain and desensitization. Also performing A/ROM while in fluido.             OT Education - 04/06/18 1132    Education provided  Yes    Education Details  Reviewed/perform HEP, putty ex's L UE; added scrubbing & walking with weight as well as gentle P/ROM for wrist flex/exten at table top.    Person(s) Educated  Patient    Methods  Explanation;Demonstration    Comprehension  Verbalized understanding;Returned demonstration       OT Short Term Goals - 04/06/18 1136      OT SHORT TERM GOAL #1   Title  Independent with initial HEP     Time  4    Period  Weeks    Status  Achieved 03/24/18      OT SHORT TERM GOAL #2   Title  Lt Wrist ROM to be 25 degrees in flexion and  extension    Baseline  See Eval & 04/06/18 note    Time  4    Period  Weeks    Status  On-going      OT SHORT TERM GOAL #3   Title  Pt to make full fist Lt hand    Baseline  03/24/18 approx 75% & L full fist 04/06/18    Time  4    Period  Weeks    Status  Achieved      OT SHORT TERM GOAL #4   Title  Pt to id edema and pain management strategies for Lt wrist and hand    Time  4    Period  Weeks    Status  Achieved        OT Long Term Goals - 04/01/18 1204      OT LONG TERM GOAL #1   Title  Independent with updated HEP    Time  8    Period  Weeks    Status  On-going      OT LONG TERM GOAL #2   Title  Pt to demo 45 degrees or greater wrist flexion and extension Lt wrist    Time  8    Period  Weeks    Status  New      OT LONG TERM GOAL #3   Title  Pt to demo 45 lbs grip strength Lt hand to assist in opening jars    Time  8    Period  Weeks    Status  New      OT LONG TERM GOAL #4   Title  Pt to return to using Lt hand as assist for all bilateral tasks    Time  8    Period  Weeks    Status  New            Plan - 04/06/18 1134    Clinical Impression Statement  Pt is Mod I putty ex's and has weaned from wrist splint. She cont to c/o nerve pain and should benefit from scrubbing and walking with wrist wright L UE.     Occupational Profile and client history currently impacting functional performance  No significant PMH but  current limitations preventing pt from doing IADLS and work tasks    Occupational performance deficits (Please refer to evaluation for details):  IADL's;Work;Leisure;Social Participation    Rehab Potential  Good    Current Impairments/barriers affecting progress:  pain, edema, stiffness    OT Frequency  2x / week    OT Duration  8 weeks    OT Treatment/Interventions  Self-care/ADL training;Moist Heat;Fluidtherapy;DME and/or AE instruction;Splinting;Therapeutic activities;Compression bandaging;Therapeutic exercise;Ultrasound;Cryotherapy;Passive  range of motion;Electrical Stimulation;Paraffin;Patient/family education;Manual Therapy    Plan  Cont fluido while performing A/ROM L wrist; scrubbing, walking with wrist weight, consider e-stim L wrist. functional activity L UE/wrist and hand.    Consulted and Agree with Plan of Care  Patient       Patient will benefit from skilled therapeutic intervention in order to improve the following deficits and impairments:  Decreased coordination, Decreased range of motion, Impaired flexibility, Increased edema, Impaired sensation, Decreased activity tolerance, Impaired UE functional use, Pain, Decreased strength  Visit Diagnosis: Stiffness of left wrist, not elsewhere classified  Stiffness of left hand, not elsewhere classified  Pain in left wrist  Pain in joints of left hand  Localized edema  Muscle weakness (generalized)  Paresthesia of skin    Problem List Patient Active Problem List   Diagnosis Date Noted  . SVD (spontaneous vaginal delivery) 10/14/2011  . Asthma   . HYPERTENSION 04/23/2008  . BRONCHITIS, ACUTE 04/03/2008  . HYPERLIPIDEMIA 11/18/2007  . DEPRESSION 11/18/2007  . ALLERGIC RHINITIS 11/18/2007  . ASTHMA 11/18/2007  . GERD 11/18/2007  . SPRAIN&STRAIN OTH [facility] SITES SHOULDER&UPPER ARM 11/18/2007    Barnhill, Amy Beth Dixon, OTR/L 04/06/2018, 11:39 AM  [facility] [address] [place], [place], [address] Phone: [phone]   Fax:  [phone]  Name: Karen Michael MRN: [id number] Date of Birth: 1976/09/19

## 2018-04-15 ENCOUNTER — Ambulatory Visit: Payer: Self-pay | Attending: Orthopaedic Surgery | Admitting: Occupational Therapy

## 2018-04-15 DIAGNOSIS — M25542 Pain in joints of left hand: Secondary | ICD-10-CM | POA: Insufficient documentation

## 2018-04-15 DIAGNOSIS — M25632 Stiffness of left wrist, not elsewhere classified: Secondary | ICD-10-CM | POA: Insufficient documentation

## 2018-04-15 DIAGNOSIS — M25532 Pain in left wrist: Secondary | ICD-10-CM | POA: Insufficient documentation

## 2018-04-15 DIAGNOSIS — M25642 Stiffness of left hand, not elsewhere classified: Secondary | ICD-10-CM | POA: Insufficient documentation

## 2018-04-15 DIAGNOSIS — R600 Localized edema: Secondary | ICD-10-CM | POA: Insufficient documentation

## 2018-04-15 DIAGNOSIS — M6281 Muscle weakness (generalized): Secondary | ICD-10-CM | POA: Insufficient documentation

## 2018-04-15 NOTE — Therapy (Signed)
[facility] [address] [place], [place], [address] Phone: [phone]   Fax:  [phone]  Occupational Therapy Treatment  Patient Details  Name: Karen Michael MRN: [id number] Date of Birth: [birth year] Referring Provider: Dr. Griffin Basil   Encounter Date: 04/15/2018  OT End of Session - 04/15/18 0926    Visit Number  5    Number of Visits  17    Date for OT Re-Evaluation  05/16/18    Authorization Type  SELF PAY    OT Start Time  0850    OT Stop Time  0930    OT Time Calculation (min)  40 min    Activity Tolerance  Patient tolerated treatment well    Behavior During Therapy  [facility] for tasks assessed/performed       Past Medical History:  Diagnosis Date  . Anxiety   . Asthma    last used inhaler this summer  . Distal radius fracture, left   . GERD (gastroesophageal reflux disease)    OTC as needed  . MRSA infection approx 6 years ago during breat feeding unsure which breast    Past Surgical History:  Procedure Laterality Date  . OPEN REDUCTION INTERNAL FIXATION (ORIF) DISTAL RADIAL FRACTURE Left 02/15/2018   Procedure: OPEN REDUCTION INTERNAL FIXATION (ORIF) LEFT DISTAL RADIAL FRACTURE;  Surgeon: Hiram Gash, MD;  Location: [facility];  Service: Orthopedics;  Laterality: Left;  . WISDOM TOOTH EXTRACTION      There were no vitals filed for this visit.  Subjective Assessment - 04/15/18 0858    Subjective   I've just been using a travel coffee mug for my weight; I haven't bought a dumbbell    Pertinent History  ORIF Lt distal radius fx 02/15/18.     Limitations  none at this time    Currently in Pain?  Yes    Pain Score  3     Pain Location  Wrist at suture site    Pain Orientation  Left    Pain Descriptors / Indicators  Tender    Pain Type  Acute pain    Pain Onset  More than a month ago    Pain Frequency  Intermittent    Aggravating Factors   touch    Pain Relieving Factors  not touching  it, pain meds                   OT Treatments/Exercises (OP) - 04/15/18 0001      Exercises   Exercises  -- UBE x 5 min. level 5, walking w/ 2 lb weight Lt hand      Wrist Exercises   Other wrist exercises  Wrist winder x 4 full revolutions (winding up/down) with 2 lb. weight.     Other wrist exercises  Wrist flexion and extension x 10 reps, 2 sets each with 3 lb. weight. Followed by weighted stretch x 5 reps each x 10 sec. hold      Hand Exercises   Other Hand Exercises  Gripper set at level 1 resistance to pick up blocks Lt hand for sustained grip strength with min difficulty. Pt issued red resistance putty     LUE Fluidotherapy   Number Minutes Fluidotherapy  10 Minutes    LUE Fluidotherapy Location  Hand;Wrist    Comments  at beginning of session for stiffness/pain and desensitization               OT Short Term Goals -  04/15/18 0926      OT SHORT TERM GOAL #1   Title  Independent with initial HEP     Time  4    Period  Weeks    Status  Achieved 03/24/18      OT SHORT TERM GOAL #2   Title  Lt Wrist ROM to be 25 degrees in flexion and extension    Baseline  See Eval & 04/06/18 note    Time  4    Period  Weeks    Status  Achieved      OT SHORT TERM GOAL #3   Title  Pt to make full fist Lt hand    Baseline  03/24/18 approx 75% & L full fist 04/06/18    Time  4    Period  Weeks    Status  Achieved      OT SHORT TERM GOAL #4   Title  Pt to id edema and pain management strategies for Lt wrist and hand    Time  4    Period  Weeks    Status  Achieved        OT Long Term Goals - 04/01/18 1204      OT LONG TERM GOAL #1   Title  Independent with updated HEP    Time  8    Period  Weeks    Status  On-going      OT LONG TERM GOAL #2   Title  Pt to demo 45 degrees or greater wrist flexion and extension Lt wrist    Time  8    Period  Weeks    Status  New      OT LONG TERM GOAL #3   Title  Pt to demo 45 lbs grip strength Lt hand to assist in  opening jars    Time  8    Period  Weeks    Status  New      OT LONG TERM GOAL #4   Title  Pt to return to using Lt hand as assist for all bilateral tasks    Time  8    Period  Weeks    Status  New            Plan - 04/15/18 6295    Clinical Impression Statement  Pt progressing in ROM Lt wrist and hand, and grip strength. Pt also improving with functional use Lt wrist and hand.     Occupational Profile and client history currently impacting functional performance  No significant PMH but current limitations preventing pt from doing IADLS and work tasks    Occupational performance deficits (Please refer to evaluation for details):  IADL's;Work;Leisure;Social Participation    Rehab Potential  Good    Current Impairments/barriers affecting progress:  pain, edema, stiffness    OT Frequency  2x / week    OT Duration  8 weeks    OT Treatment/Interventions  Self-care/ADL training;Moist Heat;Fluidtherapy;DME and/or AE instruction;Splinting;Therapeutic activities;Compression bandaging;Therapeutic exercise;Ultrasound;Cryotherapy;Passive range of motion;Electrical Stimulation;Paraffin;Patient/family education;Manual Therapy    Plan  Cont fluido while performing A/ROM L wrist; scrubbing, walking with wrist weight, consider e-stim L wrist. functional activity L UE/wrist and hand.    Consulted and Agree with Plan of Care  Patient    Family Member Consulted  husband       Patient will benefit from skilled therapeutic intervention in order to improve the following deficits and impairments:  Decreased coordination, Decreased range of motion, Impaired flexibility, Increased edema, Impaired  sensation, Decreased activity tolerance, Impaired UE functional use, Pain, Decreased strength  Visit Diagnosis: Stiffness of left wrist, not elsewhere classified  Stiffness of left hand, not elsewhere classified  Pain in left wrist  Pain in joints of left hand  Localized edema  Muscle weakness  (generalized)    Problem List Patient Active Problem List   Diagnosis Date Noted  . SVD (spontaneous vaginal delivery) 10/14/2011  . Asthma   . HYPERTENSION 04/23/2008  . BRONCHITIS, ACUTE 04/03/2008  . HYPERLIPIDEMIA 11/18/2007  . DEPRESSION 11/18/2007  . ALLERGIC RHINITIS 11/18/2007  . ASTHMA 11/18/2007  . GERD 11/18/2007  . SPRAIN&STRAIN OTH SPEC SITES SHOULDER&UPPER ARM 11/18/2007    Carey Bullocks, OTR/L 04/15/2018, 9:29 AM  [facility] [address] [place], [place], [address] Phone: [phone]   Fax:  [phone]  Name: Karen Michael MRN: [id number] Date of Birth: [birth year]

## 2018-04-22 ENCOUNTER — Ambulatory Visit: Payer: Self-pay | Admitting: Occupational Therapy

## 2018-04-22 DIAGNOSIS — M25542 Pain in joints of left hand: Secondary | ICD-10-CM

## 2018-04-22 DIAGNOSIS — M25532 Pain in left wrist: Secondary | ICD-10-CM

## 2018-04-22 DIAGNOSIS — M25642 Stiffness of left hand, not elsewhere classified: Secondary | ICD-10-CM

## 2018-04-22 DIAGNOSIS — M25632 Stiffness of left wrist, not elsewhere classified: Secondary | ICD-10-CM

## 2018-04-22 NOTE — Therapy (Signed)
[facility] [address] [place], [place], [address] Phone: [phone]   Fax:  [phone]  Occupational Therapy Treatment  Patient Details  Name: Karen Michael MRN: [id number] Date of Birth: September 17, 1976 Referring Provider: Dr. Griffin Basil   Encounter Date: 04/22/2018  OT End of Session - 04/22/18 1340    Visit Number  6    Number of Visits  17    Date for OT Re-Evaluation  05/16/18    Authorization Type  SELF PAY    OT Start Time  0933    OT Stop Time  1020    OT Time Calculation (min)  47 min       Past Medical History:  Diagnosis Date  . Anxiety   . Asthma    last used inhaler this summer  . Distal radius fracture, left   . GERD (gastroesophageal reflux disease)    OTC as needed  . MRSA infection approx 6 years ago during breat feeding unsure which breast    Past Surgical History:  Procedure Laterality Date  . OPEN REDUCTION INTERNAL FIXATION (ORIF) DISTAL RADIAL FRACTURE Left 02/15/2018   Procedure: OPEN REDUCTION INTERNAL FIXATION (ORIF) LEFT DISTAL RADIAL FRACTURE;  Surgeon: Hiram Gash, MD;  Location: [facility];  Service: Orthopedics;  Laterality: Left;  . WISDOM TOOTH EXTRACTION      There were no vitals filed for this visit.  Subjective Assessment - 04/22/18 1013    Subjective   Pt reports using a water bottle to carry in her LUE at home while walking    Pertinent History  ORIF Lt distal radius fx 02/15/18.  diagnosed with CPRS per pt report    Currently in Pain?  Yes    Pain Score  3     Pain Location  Wrist    Pain Orientation  Left    Pain Type  Acute pain    Pain Onset  More than a month ago    Pain Frequency  Intermittent    Aggravating Factors   movement    Pain Relieving Factors  rest                  Treatment:Yellow putty exercises for sustained grip for warm up. A/ROM wrist flexion and extension x 15 reps. Ambulating 150' x 3 carrying a 3 lbs weight in  left hand, with arm swing perfromed min v.c Scrubbing tabletop forwards and backwards then in circular motion with LUE, min v.c x 25 reps each NMES 50 pps 250 pw 10 secs cycle x 12 mins for increased AROM wrist extension, wrist flexion while off.No adverse reactions  Hand Exercises   Other Hand Exercises  Gripper set at level 1 resistance to pick up blocks Lt hand for sustained grip strength with min difficulty.      LUE Fluidotherapy   Number Minutes Fluidotherapy  10 Minutes    LUE Fluidotherapy Location  Hand;Wrist    Comments  at beginning of session for stiffness/pain and desensitization               OT Short Term Goals - 04/15/18 0926      OT SHORT TERM GOAL #1   Title  Independent with initial HEP     Time  4    Period  Weeks    Status  Achieved 03/24/18      OT SHORT TERM GOAL #2   Title  Lt Wrist ROM to be 25 degrees in flexion and  extension    Baseline  See Eval & 04/06/18 note    Time  4    Period  Weeks    Status  Achieved      OT SHORT TERM GOAL #3   Title  Pt to make full fist Lt hand    Baseline  03/24/18 approx 75% & L full fist 04/06/18    Time  4    Period  Weeks    Status  Achieved      OT SHORT TERM GOAL #4   Title  Pt to id edema and pain management strategies for Lt wrist and hand    Time  4    Period  Weeks    Status  Achieved        OT Long Term Goals - 04/15/18 0932      OT LONG TERM GOAL #1   Title  Independent with updated HEP    Time  8    Period  Weeks    Status  On-going      OT LONG TERM GOAL #2   Title  Pt to demo 45 degrees or greater wrist flexion and extension Lt wrist    Time  8    Period  Weeks    Status  On-going      OT LONG TERM GOAL #3   Title  Pt to demo 45 lbs grip strength Lt hand to assist in opening jars    Time  8    Period  Weeks    Status  On-going      OT LONG TERM GOAL #4   Title  Pt to return to using Lt hand as assist for all bilateral tasks    Time  8    Period  Weeks    Status  On-going             Plan - 04/22/18 1014    Clinical Impression Statement  Pt with improving with functional use Lt wrist and hand.  and wrist ROM    Occupational performance deficits (Please refer to evaluation for details):  IADL's;Work;Leisure;Social Participation    Rehab Potential  Good    Current Impairments/barriers affecting progress:  pain, edema, stiffness    OT Frequency  2x / week    OT Duration  8 weeks    OT Treatment/Interventions  Self-care/ADL training;Moist Heat;Fluidtherapy;DME and/or AE instruction;Splinting;Therapeutic activities;Compression bandaging;Therapeutic exercise;Ultrasound;Cryotherapy;Passive range of motion;Electrical Stimulation;Paraffin;Patient/family education;Manual Therapy    Plan  Cont fluido while performing A/ROM L wrist; scrubbing, walking with wrist weight, e-stim L wrist. functional activity L UE/wrist and hand.    Consulted and Agree with Plan of Care  Patient       Patient will benefit from skilled therapeutic intervention in order to improve the following deficits and impairments:  Decreased coordination, Decreased range of motion, Impaired flexibility, Increased edema, Impaired sensation, Decreased activity tolerance, Impaired UE functional use, Pain, Decreased strength  Visit Diagnosis: Stiffness of left wrist, not elsewhere classified  Stiffness of left hand, not elsewhere classified  Pain in left wrist  Pain in joints of left hand    Problem List Patient Active Problem List   Diagnosis Date Noted  . SVD (spontaneous vaginal delivery) 10/14/2011  . Asthma   . HYPERTENSION 04/23/2008  . BRONCHITIS, ACUTE 04/03/2008  . HYPERLIPIDEMIA 11/18/2007  . DEPRESSION 11/18/2007  . ALLERGIC RHINITIS 11/18/2007  . ASTHMA 11/18/2007  . GERD 11/18/2007  . SPRAIN&STRAIN OTH SPEC SITES SHOULDER&UPPER ARM 11/18/2007  RINE,KATHRYN 04/22/2018, 1:46 PM Theone Murdoch, OTR/L [phone] Phone: [phone] 1:46 PM 04/22/18 [facility] [address] [place], [place], [address] Phone: [phone]   Fax:  [phone]  Name: Maguire Killmer MRN: [id number] Date of Birth: 23-Feb-1976

## 2018-04-29 ENCOUNTER — Ambulatory Visit: Payer: Self-pay | Admitting: Occupational Therapy

## 2018-04-29 DIAGNOSIS — M25632 Stiffness of left wrist, not elsewhere classified: Secondary | ICD-10-CM

## 2018-04-29 DIAGNOSIS — M6281 Muscle weakness (generalized): Secondary | ICD-10-CM

## 2018-04-29 DIAGNOSIS — M25542 Pain in joints of left hand: Secondary | ICD-10-CM

## 2018-04-29 DIAGNOSIS — M25642 Stiffness of left hand, not elsewhere classified: Secondary | ICD-10-CM

## 2018-04-29 NOTE — Therapy (Signed)
[facility] [address] [place], [place], [address] Phone: [phone]   Fax:  [phone]  Occupational Therapy Treatment  Patient Details  Name: Karen Michael MRN: [id number] Date of Birth: 1976-08-19 Referring Provider: Dr. Griffin Basil   Encounter Date: 04/29/2018  OT End of Session - 04/29/18 0931    Visit Number  7    Number of Visits  17    Date for OT Re-Evaluation  05/16/18    Authorization Type  SELF PAY    OT Start Time  0845    OT Stop Time  0930    OT Time Calculation (min)  45 min    Activity Tolerance  Patient tolerated treatment well    Behavior During Therapy  [facility] for tasks assessed/performed       Past Medical History:  Diagnosis Date  . Anxiety   . Asthma    last used inhaler this summer  . Distal radius fracture, left   . GERD (gastroesophageal reflux disease)    OTC as needed  . MRSA infection approx 6 years ago during breat feeding unsure which breast    Past Surgical History:  Procedure Laterality Date  . OPEN REDUCTION INTERNAL FIXATION (ORIF) DISTAL RADIAL FRACTURE Left 02/15/2018   Procedure: OPEN REDUCTION INTERNAL FIXATION (ORIF) LEFT DISTAL RADIAL FRACTURE;  Surgeon: Hiram Gash, MD;  Location: [facility];  Service: Orthopedics;  Laterality: Left;  . WISDOM TOOTH EXTRACTION      There were no vitals filed for this visit.  Subjective Assessment - 04/29/18 0854    Subjective   I felt like the estim helped a lot last time   Pertinent History  ORIF Lt distal radius fx 02/15/18.  diagnosed with CPRS per pt report    Limitations  none at this time    Currently in Pain?  Yes    Pain Score  6     Pain Location  -- all fingers and thumb in left hand    Pain Orientation  Left    Pain Descriptors / Indicators  Pressure;Tightness    Pain Type  Chronic pain    Pain Frequency  Intermittent    Aggravating Factors   with gripping activity    Pain Relieving Factors  rest                   OT Treatments/Exercises (OP) - 04/29/18 0001      Wrist Exercises   Other wrist exercises  Wrist flexion and extension each x 10 reps, 2 sets with 3 lb. weight    Other wrist exercises  Scrubbing tabletop forwards and backwards then in circular motions w/ LUE x 25 reps each      Hand Exercises   Other Hand Exercises  Gripper set at level 2 resistance to pick up blocks Lt hand for sustained grip strength with max difficulty/pain today in fingers and base of thumb. Pt had to decr. resistance back down to level 1 resistance immediately today and still had pain 6/10 and mod to max difficulty d/t pain in fingers (no pain in wrist with task)       Modalities   Modalities  Electrical Stimulation      Electrical Stimulation   Electrical Stimulation Location  dorsal forearm    Electrical Stimulation Action  wrist extension    Electrical Stimulation Parameters  NMES 50 PPS, 250 PW, 10 sec. on/off cycle x 10 min. Int = 17 (pt had fluctuating responses  today)    Electrical Stimulation Goals  Strength ROM      LUE Fluidotherapy   Number Minutes Fluidotherapy  10 Minutes    LUE Fluidotherapy Location  Hand;Wrist    Comments  at beginning of session to decr. stiffness while performing wrist ROM               OT Short Term Goals - 04/15/18 0926      OT SHORT TERM GOAL #1   Title  Independent with initial HEP     Time  4    Period  Weeks    Status  Achieved 03/24/18      OT SHORT TERM GOAL #2   Title  Lt Wrist ROM to be 25 degrees in flexion and extension    Baseline  See Eval & 04/06/18 note    Time  4    Period  Weeks    Status  Achieved      OT SHORT TERM GOAL #3   Title  Pt to make full fist Lt hand    Baseline  03/24/18 approx 75% & L full fist 04/06/18    Time  4    Period  Weeks    Status  Achieved      OT SHORT TERM GOAL #4   Title  Pt to id edema and pain management strategies for Lt wrist and hand    Time  4    Period  Weeks    Status   Achieved        OT Long Term Goals - 04/15/18 0932      OT LONG TERM GOAL #1   Title  Independent with updated HEP    Time  8    Period  Weeks    Status  On-going      OT LONG TERM GOAL #2   Title  Pt to demo 45 degrees or greater wrist flexion and extension Lt wrist    Time  8    Period  Weeks    Status  On-going      OT LONG TERM GOAL #3   Title  Pt to demo 45 lbs grip strength Lt hand to assist in opening jars    Time  8    Period  Weeks    Status  On-going      OT LONG TERM GOAL #4   Title  Pt to return to using Lt hand as assist for all bilateral tasks    Time  8    Period  Weeks    Status  On-going            Plan - 04/29/18 1217    Clinical Impression Statement  Pt with improving with functional use Lt wrist and hand.  and wrist ROM    Occupational Profile and client history currently impacting functional performance  No significant PMH but current limitations preventing pt from doing IADLS and work tasks    Occupational performance deficits (Please refer to evaluation for details):  IADL's;Work;Leisure;Social Participation    Rehab Potential  Good    Current Impairments/barriers affecting progress:  pain, edema, stiffness    OT Frequency  2x / week    OT Duration  8 weeks    OT Treatment/Interventions  Self-care/ADL training;Moist Heat;Fluidtherapy;DME and/or AE instruction;Splinting;Therapeutic activities;Compression bandaging;Therapeutic exercise;Ultrasound;Cryotherapy;Passive range of motion;Electrical Stimulation;Paraffin;Patient/family education;Manual Therapy    Plan  Cont fluido while performing A/ROM L wrist; scrubbing, walking with wrist weight, e-stim L wrist. functional  activity L UE/wrist and hand.    Consulted and Agree with Plan of Care  Patient       Patient will benefit from skilled therapeutic intervention in order to improve the following deficits and impairments:  Decreased coordination, Decreased range of motion, Impaired flexibility,  Increased edema, Impaired sensation, Decreased activity tolerance, Impaired UE functional use, Pain, Decreased strength  Visit Diagnosis: Stiffness of left wrist, not elsewhere classified  Stiffness of left hand, not elsewhere classified  Pain in joints of left hand  Muscle weakness (generalized)    Problem List Patient Active Problem List   Diagnosis Date Noted  . SVD (spontaneous vaginal delivery) 10/14/2011  . Asthma   . HYPERTENSION 04/23/2008  . BRONCHITIS, ACUTE 04/03/2008  . HYPERLIPIDEMIA 11/18/2007  . DEPRESSION 11/18/2007  . ALLERGIC RHINITIS 11/18/2007  . ASTHMA 11/18/2007  . GERD 11/18/2007  . SPRAIN&STRAIN OTH [facility] SITES SHOULDER&UPPER ARM 11/18/2007    Carey Bullocks, OTR/L 04/29/2018, 12:18 PM  [facility] [address] [place], [place], [address] Phone: [phone]   Fax:  [phone]  Name: Jeanni Allshouse MRN: [id number] Date of Birth: Nov 10, 1976

## 2018-05-05 ENCOUNTER — Ambulatory Visit: Payer: Self-pay | Admitting: Occupational Therapy

## 2018-05-05 DIAGNOSIS — M25542 Pain in joints of left hand: Secondary | ICD-10-CM

## 2018-05-05 DIAGNOSIS — M25632 Stiffness of left wrist, not elsewhere classified: Secondary | ICD-10-CM

## 2018-05-05 DIAGNOSIS — M6281 Muscle weakness (generalized): Secondary | ICD-10-CM

## 2018-05-05 DIAGNOSIS — M25642 Stiffness of left hand, not elsewhere classified: Secondary | ICD-10-CM

## 2018-05-05 DIAGNOSIS — M25532 Pain in left wrist: Secondary | ICD-10-CM

## 2018-05-05 NOTE — Therapy (Signed)
[facility] [address] [place], [place], [address] Phone: [phone]   Fax:  [phone]  Occupational Therapy Treatment  Patient Details  Name: Karen Michael MRN: [id number] Date of Birth: Feb 16, 1976 Referring Provider: Dr. Griffin Basil   Encounter Date: 05/05/2018  OT End of Session - 05/05/18 0940    Visit Number  8    Number of Visits  17    Date for OT Re-Evaluation  05/16/18    Authorization Type  SELF PAY    OT Start Time  0845    OT Stop Time  0938    OT Time Calculation (min)  53 min    Activity Tolerance  Patient tolerated treatment well    Behavior During Therapy  [facility] for tasks assessed/performed       Past Medical History:  Diagnosis Date  . Anxiety   . Asthma    last used inhaler this summer  . Distal radius fracture, left   . GERD (gastroesophageal reflux disease)    OTC as needed  . MRSA infection approx 6 years ago during breat feeding unsure which breast    Past Surgical History:  Procedure Laterality Date  . OPEN REDUCTION INTERNAL FIXATION (ORIF) DISTAL RADIAL FRACTURE Left 02/15/2018   Procedure: OPEN REDUCTION INTERNAL FIXATION (ORIF) LEFT DISTAL RADIAL FRACTURE;  Surgeon: Hiram Gash, MD;  Location: [facility];  Service: Orthopedics;  Laterality: Left;  . WISDOM TOOTH EXTRACTION      There were no vitals filed for this visit.  Subjective Assessment - 05/05/18 0906    Pertinent History  ORIF Lt distal radius fx 02/15/18.  diagnosed with CPRS per pt report    Limitations  none at this time    Currently in Pain?  Yes    Pain Score  6  NO pain at rest    Pain Orientation  Left    Pain Descriptors / Indicators  Pressure;Tightness    Pain Type  Chronic pain    Pain Onset  More than a month ago    Pain Frequency  Intermittent    Aggravating Factors   with gripping activity    Pain Relieving Factors  rest                   OT Treatments/Exercises (OP) -  05/05/18 0001      Exercises   Exercises  -- UBE x 8 min. level 5 resistance       Wrist Exercises   Other wrist exercises  Passive stretches in wrist flex and ext (prayer stretch) x 3 each, holding 10 sec. or >.       Hand Exercises   Other Hand Exercises  Placing and retrieving clothespins on antenna red to blue resistance for pinch strength Lt hand       Electrical Stimulation   Electrical Stimulation Location  dorsal forearm    Electrical Stimulation Action  Wrist extension    Electrical Stimulation Parameters  NMES 50 pps, 250 pw, 10 sec. on/off cycle x 10 min. Int = b/t 16-17 (pt had fluctuating responses)    Electrical Stimulation Goals  Strength and ROM      LUE Fluidotherapy   Number Minutes Fluidotherapy  10 Minutes    LUE Fluidotherapy Location  Hand;Wrist    Comments  at beginning of session for stiffness/pain and desensitization               OT Short Term Goals - 04/15/18 0254  OT SHORT TERM GOAL #1   Title  Independent with initial HEP     Time  4    Period  Weeks    Status  Achieved 03/24/18      OT SHORT TERM GOAL #2   Title  Lt Wrist ROM to be 25 degrees in flexion and extension    Baseline  See Eval & 04/06/18 note    Time  4    Period  Weeks    Status  Achieved      OT SHORT TERM GOAL #3   Title  Pt to make full fist Lt hand    Baseline  03/24/18 approx 75% & L full fist 04/06/18    Time  4    Period  Weeks    Status  Achieved      OT SHORT TERM GOAL #4   Title  Pt to id edema and pain management strategies for Lt wrist and hand    Time  4    Period  Weeks    Status  Achieved        OT Long Term Goals - 04/15/18 0932      OT LONG TERM GOAL #1   Title  Independent with updated HEP    Time  8    Period  Weeks    Status  On-going      OT LONG TERM GOAL #2   Title  Pt to demo 45 degrees or greater wrist flexion and extension Lt wrist    Time  8    Period  Weeks    Status  On-going      OT LONG TERM GOAL #3   Title  Pt to  demo 45 lbs grip strength Lt hand to assist in opening jars    Time  8    Period  Weeks    Status  On-going      OT LONG TERM GOAL #4   Title  Pt to return to using Lt hand as assist for all bilateral tasks    Time  8    Period  Weeks    Status  On-going            Plan - 05/05/18 0941    Clinical Impression Statement  Pt with improving with functional use Lt wrist and hand.  and wrist ROM    Occupational Profile and client history currently impacting functional performance  No significant PMH but current limitations preventing pt from doing IADLS and work tasks    Rehab Potential  Good    Current Impairments/barriers affecting progress:  pain, edema, stiffness    OT Frequency  2x / week    OT Duration  8 weeks    OT Treatment/Interventions  Self-care/ADL training;Moist Heat;Fluidtherapy;DME and/or AE instruction;Splinting;Therapeutic activities;Compression bandaging;Therapeutic exercise;Ultrasound;Cryotherapy;Passive range of motion;Electrical Stimulation;Paraffin;Patient/family education;Manual Therapy    Plan  continue fluido, A/ROM, strengthening and fx'al use Lt wrist/hand. Begin checking LTG's and discuss renewal (vs. d/c) since pt was only coming 1x/wk    Consulted and Agree with Plan of Care  Patient       Patient will benefit from skilled therapeutic intervention in order to improve the following deficits and impairments:  Decreased coordination, Decreased range of motion, Impaired flexibility, Increased edema, Impaired sensation, Decreased activity tolerance, Impaired UE functional use, Pain, Decreased strength  Visit Diagnosis: Stiffness of left wrist, not elsewhere classified  Stiffness of left hand, not elsewhere classified  Pain in joints of left hand  Muscle weakness (generalized)  Pain in left wrist    Problem List Patient Active Problem List   Diagnosis Date Noted  . SVD (spontaneous vaginal delivery) 10/14/2011  . Asthma   . HYPERTENSION 04/23/2008   . BRONCHITIS, ACUTE 04/03/2008  . HYPERLIPIDEMIA 11/18/2007  . DEPRESSION 11/18/2007  . ALLERGIC RHINITIS 11/18/2007  . ASTHMA 11/18/2007  . GERD 11/18/2007  . SPRAIN&STRAIN OTH [facility] SITES SHOULDER&UPPER ARM 11/18/2007    Carey Bullocks, OTR/L 05/05/2018, 9:42 AM  [facility] [address] [place], [place], [address] Phone: [phone]   Fax:  [phone]  Name: Karen Michael MRN: [id number] Date of Birth: 1976/02/18

## 2018-05-13 ENCOUNTER — Ambulatory Visit: Payer: Self-pay | Admitting: Occupational Therapy

## 2018-05-13 DIAGNOSIS — M25532 Pain in left wrist: Secondary | ICD-10-CM

## 2018-05-13 DIAGNOSIS — M25632 Stiffness of left wrist, not elsewhere classified: Secondary | ICD-10-CM

## 2018-05-13 DIAGNOSIS — M25642 Stiffness of left hand, not elsewhere classified: Secondary | ICD-10-CM

## 2018-05-13 DIAGNOSIS — M6281 Muscle weakness (generalized): Secondary | ICD-10-CM

## 2018-05-13 DIAGNOSIS — M25542 Pain in joints of left hand: Secondary | ICD-10-CM

## 2018-05-13 NOTE — Therapy (Signed)
Karen Michael Michael [address] [place], [place], [address] Phone: [phone]   Fax:  [phone]  Occupational Therapy Treatment  Patient Details  Name: Karen Michael Michael MRN: [id number] Date of Birth: 1976/04/29 Referring Provider: Dr. Griffin Michael   Encounter Date: 05/13/2018  OT End of Session - 05/13/18 1011    Visit Number  9    Number of Visits  17    Date for OT Re-Evaluation  06/15/18    Authorization Type  SELF PAY, however pt now approved 100% financial assistance through [employer]    OT Start Time  0933    OT Stop Time  1020    OT Time Calculation (min)  47 min    Activity Tolerance  Patient tolerated treatment well    Behavior During Therapy  [facility] for tasks assessed/performed       Past Medical History:  Diagnosis Date  . Anxiety   . Asthma    last used inhaler this summer  . Distal radius fracture, left   . GERD (gastroesophageal reflux disease)    OTC as needed  . MRSA infection approx 6 years ago during breat feeding unsure which breast    Past Surgical History:  Procedure Laterality Date  . OPEN REDUCTION INTERNAL FIXATION (ORIF) DISTAL RADIAL FRACTURE Left 02/15/2018   Procedure: OPEN REDUCTION INTERNAL FIXATION (ORIF) LEFT DISTAL RADIAL FRACTURE;  Surgeon: Karen Michael Gash, MD;  Location: [facility];  Service: Orthopedics;  Laterality: Left;  . WISDOM TOOTH EXTRACTION      There were no vitals filed for this visit.  Subjective Assessment - 05/13/18 0942    Pertinent History  ORIF Lt distal radius fx 02/15/18.  diagnosed with CPRS per pt report    Limitations  none at this time    Currently in Pain?  Yes    Pain Score  5  no pain at rest    Pain Location  Finger (Comment which one) all fingers    Pain Orientation  Left    Pain Descriptors / Indicators  Aching    Pain Type  Chronic pain    Pain Onset  More than a month ago    Pain Frequency  Intermittent    Aggravating Factors   squeezing,  gripping    Pain Relieving Factors  rest         [facility] OT Assessment - 05/13/18 0001      AROM   Overall AROM Comments  Lt wrist flex = 60*, ext = 52* (Rt wrist flex = 70*, ext = 62*), sup/pron WNL's    AROM Assessment Site  --      Hand Function   Right Hand Grip (lbs)  82 lbs    Right Hand Lateral Pinch  19 lbs    Right Hand 3 Point Pinch  18 lbs    Left Hand Grip (lbs)  48 lbs    Left Hand Lateral Pinch  15 lbs    Left 3 point pinch  10 lbs               OT Treatments/Exercises (OP) - 05/13/18 0001      ADLs   ADL Comments  Re-assessed ROM Lt wrist, grip strength, and pinch strength for renewal period and discussed goals for renewal period - see assessment and new LTG's      Exercises   Exercises  -- UBE x 5 min. Level 5      Hand Exercises   Other  Hand Exercises  "wringing out" washcloth for grip strength with rotation in prep for opening tighter jars/bottles    Other Hand Exercises  Placing and retrieving clothespins on antenna red to black resistance for pinch strength Lt hand       LUE Fluidotherapy   Number Minutes Fluidotherapy  12 Minutes    LUE Fluidotherapy Location  Hand;Wrist    Comments  at beginning of session to decr. stiffness/pain               OT Short Term Goals - 04/15/18 0926      OT SHORT TERM GOAL #1   Title  Independent with initial HEP     Time  4    Period  Weeks    Status  Achieved 03/24/18      OT SHORT TERM GOAL #2   Title  Lt Wrist ROM to be 25 degrees in flexion and extension    Baseline  See Eval & 04/06/18 note    Time  4    Period  Weeks    Status  Achieved      OT SHORT TERM GOAL #3   Title  Pt to make full fist Lt hand    Baseline  03/24/18 approx 75% & L full fist 04/06/18    Time  4    Period  Weeks    Status  Achieved      OT SHORT TERM GOAL #4   Title  Pt to id edema and pain management strategies for Lt wrist and hand    Time  4    Period  Weeks    Status  Achieved        OT Long Term Goals  - 05/13/18 1013      OT LONG TERM GOAL #1   Title  Independent with updated HEP    Time  8    Period  Weeks    Status  Achieved      OT LONG TERM GOAL #2   Title  Pt to demo 45 degrees or greater wrist flexion and extension Lt wrist    Time  8    Period  Weeks    Status  Achieved 05/13/18: Lt wrist flex = 60*, ext = 52*      OT LONG TERM GOAL #3   Title  Pt to demo 45 lbs grip strength Lt hand to assist in opening jars    Time  8    Period  Weeks    Status  Achieved 05/13/18: 48 lbs      OT LONG TERM GOAL #4   Title  Pt to return to using Lt hand as assist for all bilateral tasks    Time  8    Period  Weeks    Status  Achieved      OT LONG TERM GOAL #5   Title  Lt wrist extension to 60* for functional tasks    Baseline  52* at renewal period    Time  4    Period  Weeks    Status  New    Target Date  06/15/18      Long Term Additional Goals   Additional Long Term Goals  Yes      OT LONG TERM GOAL #6   Title  Grip strength Lt hand to be 55 lbs to open tight jars    Time  4    Period  Weeks    Status  New  OT LONG TERM GOAL #7   Title  Pt to improve lateral and 3 tip pinch Lt hand by 3 lbs or greater to assist in opening medicine bottles and smaller jars    Time  4    Period  Weeks    Status  New            Plan - 05/13/18 1147    Clinical Impression Statement  Pt with improved wrist ROM and grip strength. Pt has met all original LTG's. Will renew to address 3 new/updated LTG's.     Occupational Profile and client history currently impacting functional performance  No significant PMH but current limitations preventing pt from doing IADLS and work tasks    Occupational performance deficits (Please refer to evaluation for details):  IADL's;Work;Leisure;Social Participation    Rehab Potential  Good    Current Impairments/barriers affecting progress:  pain, edema, stiffness    OT Frequency  2x / week    OT Duration  4 more weeks    OT Treatment/Interventions   Self-care/ADL training;Moist Heat;Fluidtherapy;DME and/or AE instruction;Splinting;Therapeutic activities;Compression bandaging;Therapeutic exercise;Ultrasound;Cryotherapy;Passive range of motion;Electrical Stimulation;Paraffin;Patient/family education;Manual Therapy    Plan  Renewal completed today, progress towards new LTG's    Consulted and Agree with Plan of Care  Patient       Patient will benefit from skilled therapeutic intervention in order to improve the following deficits and impairments:  Decreased coordination, Decreased range of motion, Impaired flexibility, Increased edema, Impaired sensation, Decreased activity tolerance, Impaired UE functional use, Pain, Decreased strength  Visit Diagnosis: Stiffness of left wrist, not elsewhere classified - Plan: Ot plan of care cert/re-cert  Stiffness of left hand, not elsewhere classified - Plan: Ot plan of care cert/re-cert  Pain in joints of left hand - Plan: Ot plan of care cert/re-cert  Muscle weakness (generalized) - Plan: Ot plan of care cert/re-cert  Pain in left wrist - Plan: Ot plan of care cert/re-cert    Problem List Patient Active Problem List   Diagnosis Date Noted  . SVD (spontaneous vaginal delivery) 10/14/2011  . Asthma   . HYPERTENSION 04/23/2008  . BRONCHITIS, ACUTE 04/03/2008  . HYPERLIPIDEMIA 11/18/2007  . DEPRESSION 11/18/2007  . ALLERGIC RHINITIS 11/18/2007  . ASTHMA 11/18/2007  . GERD 11/18/2007  . SPRAIN&STRAIN OTH SPEC SITES SHOULDER&UPPER ARM 11/18/2007    Carey Bullocks, OTR/L 05/13/2018, 11:53 AM  [facility] [address] [place], [place], [address] Phone: [phone]   Fax:  [phone]  Name: Karen Michael Michael MRN: [id number] Date of Birth: Mar 16, 1976

## 2018-05-20 ENCOUNTER — Encounter: Payer: Self-pay | Admitting: Occupational Therapy

## 2018-05-20 ENCOUNTER — Ambulatory Visit: Payer: No Typology Code available for payment source | Attending: Orthopaedic Surgery | Admitting: Occupational Therapy

## 2018-05-20 DIAGNOSIS — M25542 Pain in joints of left hand: Secondary | ICD-10-CM

## 2018-05-20 DIAGNOSIS — M6281 Muscle weakness (generalized): Secondary | ICD-10-CM

## 2018-05-20 DIAGNOSIS — M25642 Stiffness of left hand, not elsewhere classified: Secondary | ICD-10-CM

## 2018-05-20 DIAGNOSIS — M25532 Pain in left wrist: Secondary | ICD-10-CM

## 2018-05-20 DIAGNOSIS — M25632 Stiffness of left wrist, not elsewhere classified: Secondary | ICD-10-CM

## 2018-05-20 NOTE — Therapy (Signed)
[facility] [address] [place], [place], [address] Phone: [phone]   Fax:  [phone]  Occupational Therapy Treatment  Patient Details  Name: Karen Michael MRN: [id number] Date of Birth: 24-Sep-1976 Referring Provider: Dr. Griffin Basil   Encounter Date: 05/20/2018  OT End of Session - 05/20/18 1147    Visit Number  10    Number of Visits  17    Date for OT Re-Evaluation  06/15/18    Authorization Type  SELF PAY, however pt now approved 100% financial assistance through [employer]    OT Start Time  1105    OT Stop Time  1148    OT Time Calculation (min)  43 min    Activity Tolerance  Patient tolerated treatment well    Behavior During Therapy  [facility] for tasks assessed/performed       Past Medical History:  Diagnosis Date  . Anxiety   . Asthma    last used inhaler this summer  . Distal radius fracture, left   . GERD (gastroesophageal reflux disease)    OTC as needed  . MRSA infection approx 6 years ago during breat feeding unsure which breast    Past Surgical History:  Procedure Laterality Date  . OPEN REDUCTION INTERNAL FIXATION (ORIF) DISTAL RADIAL FRACTURE Left 02/15/2018   Procedure: OPEN REDUCTION INTERNAL FIXATION (ORIF) LEFT DISTAL RADIAL FRACTURE;  Surgeon: Hiram Gash, MD;  Location: [facility];  Service: Orthopedics;  Laterality: Left;  . WISDOM TOOTH EXTRACTION      There were no vitals filed for this visit.  Subjective Assessment - 05/20/18 1145    Subjective   Pt reports her hand feels really tight    Pertinent History  ORIF Lt distal radius fx 02/15/18.  diagnosed with CPRS per pt report    Currently in Pain?  Yes    Pain Score  7     Pain Location  Finger (Comment which one)    Pain Orientation  Left    Pain Type  Acute pain    Pain Onset  More than a month ago    Pain Frequency  Intermittent    Aggravating Factors   exercise/ use    Pain Relieving Factors  rest     Multiple  Pain Sites  Yes                 Treatment: Fluidotherapy x 12 mins to LUE for stiffness and pain, no adverse reactions. Tendon gliding exercises x 10 reps min v.c following wringing out  Washcloth x 10 reps Wrist flexion/ extension both directions with 3 lbs weight x 10 reps each. Closed chain shoulder flexion, extension and abduction then doorway stretch due to shoulder tightness. Graded clothespins for sustained tip and 3 pt pinch, min difficulty. Pen rolling exercise for IP and composite finger flexion, min v.c  Arm bike x 5 mins level 4 resistance for conditioning            OT Short Term Goals - 04/15/18 0926      OT SHORT TERM GOAL #1   Title  Independent with initial HEP     Time  4    Period  Weeks    Status  Achieved 03/24/18      OT SHORT TERM GOAL #2   Title  Lt Wrist ROM to be 25 degrees in flexion and extension    Baseline  See Eval & 04/06/18 note    Time  4  Period  Weeks    Status  Achieved      OT SHORT TERM GOAL #3   Title  Pt to make full fist Lt hand    Baseline  03/24/18 approx 75% & L full fist 04/06/18    Time  4    Period  Weeks    Status  Achieved      OT SHORT TERM GOAL #4   Title  Pt to id edema and pain management strategies for Lt wrist and hand    Time  4    Period  Weeks    Status  Achieved        OT Long Term Goals - 05/13/18 1013      OT LONG TERM GOAL #1   Title  Independent with updated HEP    Time  8    Period  Weeks    Status  Achieved      OT LONG TERM GOAL #2   Title  Pt to demo 45 degrees or greater wrist flexion and extension Lt wrist    Time  8    Period  Weeks    Status  Achieved 05/13/18: Lt wrist flex = 60*, ext = 52*      OT LONG TERM GOAL #3   Title  Pt to demo 45 lbs grip strength Lt hand to assist in opening jars    Time  8    Period  Weeks    Status  Achieved 05/13/18: 48 lbs      OT LONG TERM GOAL #4   Title  Pt to return to using Lt hand as assist for all bilateral tasks    Time  8     Period  Weeks    Status  Achieved      OT LONG TERM GOAL #5   Title  Lt wrist extension to 60* for functional tasks    Baseline  52* at renewal period    Time  4    Period  Weeks    Status  New    Target Date  06/15/18      Long Term Additional Goals   Additional Long Term Goals  Yes      OT LONG TERM GOAL #6   Title  Grip strength Lt hand to be 55 lbs to open tight jars    Time  4    Period  Weeks    Status  New      OT LONG TERM GOAL #7   Title  Pt to improve lateral and 3 tip pinch Lt hand by 3 lbs or greater to assist in opening medicine bottles and smaller jars    Time  4    Period  Weeks    Status  New            Plan - 05/20/18 1147    Clinical Impression Statement  Pt is progressing towards goals. She remians limited by stiffness and pain in her hand.    Occupational Profile and client history currently impacting functional performance  No significant PMH but current limitations preventing pt from doing IADLS and work tasks    Occupational performance deficits (Please refer to evaluation for details):  IADL's;Work;Leisure;Social Participation    Rehab Potential  Good    Current Impairments/barriers affecting progress:  pain, edema, stiffness    OT Frequency  2x / week    OT Duration  4 weeks    OT Treatment/Interventions  Self-care/ADL training;Moist  Heat;Fluidtherapy;DME and/or AE instruction;Splinting;Therapeutic activities;Compression bandaging;Therapeutic exercise;Ultrasound;Cryotherapy;Passive range of motion;Electrical Stimulation;Paraffin;Patient/family education;Manual Therapy    Plan  continue to work towards long term goals    Consulted and Agree with Plan of Care  Patient       Patient will benefit from skilled therapeutic intervention in order to improve the following deficits and impairments:  Decreased coordination, Decreased range of motion, Impaired flexibility, Increased edema, Impaired sensation, Decreased activity tolerance, Impaired UE  functional use, Pain, Decreased strength  Visit Diagnosis: Stiffness of left wrist, not elsewhere classified  Stiffness of left hand, not elsewhere classified  Pain in joints of left hand  Muscle weakness (generalized)  Pain in left wrist    Problem List Patient Active Problem List   Diagnosis Date Noted  . SVD (spontaneous vaginal delivery) 10/14/2011  . Asthma   . HYPERTENSION 04/23/2008  . BRONCHITIS, ACUTE 04/03/2008  . HYPERLIPIDEMIA 11/18/2007  . DEPRESSION 11/18/2007  . ALLERGIC RHINITIS 11/18/2007  . ASTHMA 11/18/2007  . GERD 11/18/2007  . SPRAIN&STRAIN OTH SPEC SITES SHOULDER&UPPER ARM 11/18/2007    Amadeus Oyama 05/20/2018, 12:08 PM  [facility] [address] [place], [place], [address] Phone: [phone]   Fax:  [phone]  Name: Karen Michael MRN: [id number] Date of Birth: 1976-08-27

## 2018-05-28 ENCOUNTER — Encounter

## 2018-06-01 ENCOUNTER — Ambulatory Visit: Payer: No Typology Code available for payment source | Admitting: Occupational Therapy

## 2018-06-01 DIAGNOSIS — M25542 Pain in joints of left hand: Secondary | ICD-10-CM

## 2018-06-01 DIAGNOSIS — M25642 Stiffness of left hand, not elsewhere classified: Secondary | ICD-10-CM

## 2018-06-01 DIAGNOSIS — M6281 Muscle weakness (generalized): Secondary | ICD-10-CM

## 2018-06-01 DIAGNOSIS — M25632 Stiffness of left wrist, not elsewhere classified: Secondary | ICD-10-CM

## 2018-06-01 NOTE — Therapy (Signed)
[facility] [address] [place], [place], [address] Phone: [phone]   Fax:  [phone]  Occupational Therapy Treatment  Patient Details  Name: Karen Michael MRN: [id number] Date of Birth: Oct 25, 1976 Referring Provider: Dr. Griffin Basil   Encounter Date: 06/01/2018  OT End of Session - 06/01/18 1013    Visit Number  11    Number of Visits  17    Date for OT Re-Evaluation  06/15/18    Authorization Type  SELF PAY, however pt now approved 100% financial assistance through [employer]    OT Start Time  0930    OT Stop Time  1015    OT Time Calculation (min)  45 min    Activity Tolerance  Patient tolerated treatment well    Behavior During Therapy  [facility] for tasks assessed/performed       Past Medical History:  Diagnosis Date  . Anxiety   . Asthma    last used inhaler this summer  . Distal radius fracture, left   . GERD (gastroesophageal reflux disease)    OTC as needed  . MRSA infection approx 6 years ago during breat feeding unsure which breast    Past Surgical History:  Procedure Laterality Date  . OPEN REDUCTION INTERNAL FIXATION (ORIF) DISTAL RADIAL FRACTURE Left 02/15/2018   Procedure: OPEN REDUCTION INTERNAL FIXATION (ORIF) LEFT DISTAL RADIAL FRACTURE;  Surgeon: Hiram Gash, MD;  Location: [facility];  Service: Orthopedics;  Laterality: Left;  . WISDOM TOOTH EXTRACTION      There were no vitals filed for this visit.  Subjective Assessment - 06/01/18 0937    Subjective   My fingers feel really tight. My wrist doesn't really bother me, unless I push myself up    Pertinent History  ORIF Lt distal radius fx 02/15/18.  diagnosed with CPRS per pt report    Limitations  none at this time    Currently in Pain?  Yes    Pain Score  7     Pain Location  -- All fingers    Pain Orientation  Left    Pain Descriptors / Indicators  Tightness    Pain Type  Acute pain    Pain Onset  More than a month ago    Pain Frequency  Intermittent    Aggravating Factors   exercise/use    Pain Relieving Factors  rest                   OT Treatments/Exercises (OP) - 06/01/18 0001      Exercises   Exercises  -- UBE x 5 min. level 5 resistance      Wrist Exercises   Other wrist exercises  Passive stretches in wrist ext (prayer stretch) x 3 each, holding 10 sec. or >.       Hand Exercises   Other Hand Exercises  Gripper set at level 2 resistance to pick up blocks for sustained grip strength Lt hand (Attempted at level 3 resistance, but max difficulty on 1st block). Pt with mod difficulty and pain. Wringing out washcloth with rotation to assist w/ opening jars/bottles    Other Hand Exercises  Placing and retrieving clothespins on antenna red to black resistance for pinch strength Lt hand       LUE Fluidotherapy   Number Minutes Fluidotherapy  12 Minutes    LUE Fluidotherapy Location  Hand;Wrist    Comments  at beginning of session for stiffness/pain and desensitization  OT Short Term Goals - 04/15/18 0926      OT SHORT TERM GOAL #1   Title  Independent with initial HEP     Time  4    Period  Weeks    Status  Achieved 03/24/18      OT SHORT TERM GOAL #2   Title  Lt Wrist ROM to be 25 degrees in flexion and extension    Baseline  See Eval & 04/06/18 note    Time  4    Period  Weeks    Status  Achieved      OT SHORT TERM GOAL #3   Title  Pt to make full fist Lt hand    Baseline  03/24/18 approx 75% & L full fist 04/06/18    Time  4    Period  Weeks    Status  Achieved      OT SHORT TERM GOAL #4   Title  Pt to id edema and pain management strategies for Lt wrist and hand    Time  4    Period  Weeks    Status  Achieved        OT Long Term Goals - 05/13/18 1013      OT LONG TERM GOAL #1   Title  Independent with updated HEP    Time  8    Period  Weeks    Status  Achieved      OT LONG TERM GOAL #2   Title  Pt to demo 45 degrees or greater wrist  flexion and extension Lt wrist    Time  8    Period  Weeks    Status  Achieved 05/13/18: Lt wrist flex = 60*, ext = 52*      OT LONG TERM GOAL #3   Title  Pt to demo 45 lbs grip strength Lt hand to assist in opening jars    Time  8    Period  Weeks    Status  Achieved 05/13/18: 48 lbs      OT LONG TERM GOAL #4   Title  Pt to return to using Lt hand as assist for all bilateral tasks    Time  8    Period  Weeks    Status  Achieved      OT LONG TERM GOAL #5   Title  Lt wrist extension to 60* for functional tasks    Baseline  52* at renewal period    Time  4    Period  Weeks    Status  New    Target Date  06/15/18      Long Term Additional Goals   Additional Long Term Goals  Yes      OT LONG TERM GOAL #6   Title  Grip strength Lt hand to be 55 lbs to open tight jars    Time  4    Period  Weeks    Status  New      OT LONG TERM GOAL #7   Title  Pt to improve lateral and 3 tip pinch Lt hand by 3 lbs or greater to assist in opening medicine bottles and smaller jars    Time  4    Period  Weeks    Status  New            Plan - 06/01/18 1013    Clinical Impression Statement  Pt is progressing towards goals. She remians limited by stiffness and  pain in her hand.    Occupational Profile and client history currently impacting functional performance  No significant PMH but current limitations preventing pt from doing IADLS and work tasks    Rehab Potential  Good    Current Impairments/barriers affecting progress:  pain, edema, stiffness    OT Frequency  2x / week    OT Duration  4 weeks    OT Treatment/Interventions  Self-care/ADL training;Moist Heat;Fluidtherapy;DME and/or AE instruction;Splinting;Therapeutic activities;Compression bandaging;Therapeutic exercise;Ultrasound;Cryotherapy;Passive range of motion;Electrical Stimulation;Paraffin;Patient/family education;Manual Therapy    Plan  continue fluido, and  work towards long term goals    Consulted and Agree with Plan of  Care  Patient       Patient will benefit from skilled therapeutic intervention in order to improve the following deficits and impairments:  Decreased coordination, Decreased range of motion, Impaired flexibility, Increased edema, Impaired sensation, Decreased activity tolerance, Impaired UE functional use, Pain, Decreased strength  Visit Diagnosis: Stiffness of left wrist, not elsewhere classified  Stiffness of left hand, not elsewhere classified  Pain in joints of left hand  Muscle weakness (generalized)    Problem List Patient Active Problem List   Diagnosis Date Noted  . SVD (spontaneous vaginal delivery) 10/14/2011  . Asthma   . HYPERTENSION 04/23/2008  . BRONCHITIS, ACUTE 04/03/2008  . HYPERLIPIDEMIA 11/18/2007  . DEPRESSION 11/18/2007  . ALLERGIC RHINITIS 11/18/2007  . ASTHMA 11/18/2007  . GERD 11/18/2007  . SPRAIN&STRAIN OTH SPEC SITES SHOULDER&UPPER ARM 11/18/2007    Carey Bullocks, OTR/L 06/01/2018, 10:14 AM  [facility] [address] [place], [place], [address] Phone: [phone]   Fax:  [phone]  Name: Tykeshia Tourangeau MRN: [id number] Date of Birth: 1976-09-26

## 2018-06-04 ENCOUNTER — Ambulatory Visit: Payer: No Typology Code available for payment source | Admitting: Occupational Therapy

## 2018-06-04 DIAGNOSIS — M25642 Stiffness of left hand, not elsewhere classified: Secondary | ICD-10-CM

## 2018-06-04 DIAGNOSIS — M25542 Pain in joints of left hand: Secondary | ICD-10-CM

## 2018-06-04 DIAGNOSIS — M25632 Stiffness of left wrist, not elsewhere classified: Secondary | ICD-10-CM

## 2018-06-04 DIAGNOSIS — M6281 Muscle weakness (generalized): Secondary | ICD-10-CM

## 2018-06-04 NOTE — Therapy (Signed)
[facility] [address] [place], [place], [address] Phone: [phone]   Fax:  [phone]  Occupational Therapy Treatment  Patient Details  Name: Karen Michael MRN: [id number] Date of Birth: [birth year] Referring Provider: Dr. Griffin Basil   Encounter Date: 06/04/2018  OT End of Session - 06/04/18 1116    Visit Number  12    Number of Visits  17    Date for OT Re-Evaluation  06/15/18    Authorization Type  SELF PAY, however pt now approved 100% financial assistance through [employer]    OT Start Time  0930    OT Stop Time  1017    OT Time Calculation (min)  47 min    Activity Tolerance  Patient tolerated treatment well    Behavior During Therapy  [facility] for tasks assessed/performed       Past Medical History:  Diagnosis Date  . Anxiety   . Asthma    last used inhaler this summer  . Distal radius fracture, left   . GERD (gastroesophageal reflux disease)    OTC as needed  . MRSA infection approx 6 years ago during breat feeding unsure which breast    Past Surgical History:  Procedure Laterality Date  . OPEN REDUCTION INTERNAL FIXATION (ORIF) DISTAL RADIAL FRACTURE Left 02/15/2018   Procedure: OPEN REDUCTION INTERNAL FIXATION (ORIF) LEFT DISTAL RADIAL FRACTURE;  Surgeon: Hiram Gash, MD;  Location: [facility];  Service: Orthopedics;  Laterality: Left;  . WISDOM TOOTH EXTRACTION      There were no vitals filed for this visit.  Subjective Assessment - 06/04/18 0947    Subjective   My wrist won't allow me to push up     Pertinent History  ORIF Lt distal radius fx 02/15/18.  diagnosed with CPRS per pt report    Limitations  none at this time    Currently in Pain?  Yes    Pain Score  7     Pain Location  -- All fingers    Pain Orientation  Left    Pain Descriptors / Indicators  Tightness    Pain Type  Acute pain    Pain Onset  More than a month ago    Pain Frequency  Intermittent    Aggravating Factors    bending fingers    Pain Relieving Factors  rest                   OT Treatments/Exercises (OP) - 06/04/18 0001      ADLs   ADL Comments  Discussed POC ending 06/16/18. Discussed further pain management strategies - recommended foot spa for Lt hand for home use      Hand Exercises   Other Hand Exercises  wrist flexion and extension x 10 reps, 2 sets each with 3 lb. weight, followed by weighted stretches x 10 sec. 2 reps each way. Pt also shown wt bearing ex's for Lt wrist including standing over table w/ anterior wt shifts, and modified push ups at wall (will have pt practice next session)     Other Hand Exercises  Pen rolling ex for IP flexion      LUE Fluidotherapy   Number Minutes Fluidotherapy  12 Minutes    LUE Fluidotherapy Location  Hand;Wrist    Comments  at beginning of session for stiffness/pain       Splinting   Splinting  Fabricated finger splints using elastic for both PIP/DIP flexion for digits 2-4 Lt  hand. Pt instructed to only wear for 1 minute at a time (d/t intense stretch) initially, and gradually build up to 5 minutes as able approx. every 2 hours (not to exceed 6 times/day). Pt instructed NOT to wear more than 5 minutes at a time.                OT Short Term Goals - 04/15/18 0926      OT SHORT TERM GOAL #1   Title  Independent with initial HEP     Time  4    Period  Weeks    Status  Achieved 03/24/18      OT SHORT TERM GOAL #2   Title  Lt Wrist ROM to be 25 degrees in flexion and extension    Baseline  See Eval & 04/06/18 note    Time  4    Period  Weeks    Status  Achieved      OT SHORT TERM GOAL #3   Title  Pt to make full fist Lt hand    Baseline  03/24/18 approx 75% & L full fist 04/06/18    Time  4    Period  Weeks    Status  Achieved      OT SHORT TERM GOAL #4   Title  Pt to id edema and pain management strategies for Lt wrist and hand    Time  4    Period  Weeks    Status  Achieved        OT Long Term Goals - 05/13/18  1013      OT LONG TERM GOAL #1   Title  Independent with updated HEP    Time  8    Period  Weeks    Status  Achieved      OT LONG TERM GOAL #2   Title  Pt to demo 45 degrees or greater wrist flexion and extension Lt wrist    Time  8    Period  Weeks    Status  Achieved 05/13/18: Lt wrist flex = 60*, ext = 52*      OT LONG TERM GOAL #3   Title  Pt to demo 45 lbs grip strength Lt hand to assist in opening jars    Time  8    Period  Weeks    Status  Achieved 05/13/18: 48 lbs      OT LONG TERM GOAL #4   Title  Pt to return to using Lt hand as assist for all bilateral tasks    Time  8    Period  Weeks    Status  Achieved      OT LONG TERM GOAL #5   Title  Lt wrist extension to 60* for functional tasks    Baseline  52* at renewal period    Time  4    Period  Weeks    Status  New    Target Date  06/15/18      Long Term Additional Goals   Additional Long Term Goals  Yes      OT LONG TERM GOAL #6   Title  Grip strength Lt hand to be 55 lbs to open tight jars    Time  4    Period  Weeks    Status  New      OT LONG TERM GOAL #7   Title  Pt to improve lateral and 3 tip pinch Lt hand by 3 lbs or  greater to assist in opening medicine bottles and smaller jars    Time  4    Period  Weeks    Status  New            Plan - 06/04/18 1116    Clinical Impression Statement  Pt is progressing towards goals. She remians limited by stiffness and pain in her hand.    Occupational Profile and client history currently impacting functional performance  No significant PMH but current limitations preventing pt from doing IADLS and work tasks    Occupational performance deficits (Please refer to evaluation for details):  IADL's;Work;Leisure;Social Participation    Rehab Potential  Good    Current Impairments/barriers affecting progress:  pain, edema, stiffness    OT Frequency  2x / week    OT Duration  4 weeks    OT Treatment/Interventions  Self-care/ADL training;Moist  Heat;Fluidtherapy;DME and/or AE instruction;Splinting;Therapeutic activities;Compression bandaging;Therapeutic exercise;Ultrasound;Cryotherapy;Passive range of motion;Electrical Stimulation;Paraffin;Patient/family education;Manual Therapy    Plan  continue fluido, wt. bearing over table and modified push ups, work towards long term goals    Consulted and Agree with Plan of Care  Patient       Patient will benefit from skilled therapeutic intervention in order to improve the following deficits and impairments:  Decreased coordination, Decreased range of motion, Impaired flexibility, Increased edema, Impaired sensation, Decreased activity tolerance, Impaired UE functional use, Pain, Decreased strength  Visit Diagnosis: Stiffness of left wrist, not elsewhere classified  Stiffness of left hand, not elsewhere classified  Pain in joints of left hand  Muscle weakness (generalized)    Problem List Patient Active Problem List   Diagnosis Date Noted  . SVD (spontaneous vaginal delivery) 10/14/2011  . Asthma   . HYPERTENSION 04/23/2008  . BRONCHITIS, ACUTE 04/03/2008  . HYPERLIPIDEMIA 11/18/2007  . DEPRESSION 11/18/2007  . ALLERGIC RHINITIS 11/18/2007  . ASTHMA 11/18/2007  . GERD 11/18/2007  . SPRAIN&STRAIN OTH SPEC SITES SHOULDER&UPPER ARM 11/18/2007    Carey Bullocks, OTR/L 06/04/2018, 11:20 AM  [facility] [address] [place], [place], [address] Phone: [phone]   Fax:  [phone]  Name: Citlali Gautney MRN: [id number] Date of Birth: [birth year]

## 2018-06-07 ENCOUNTER — Ambulatory Visit: Payer: No Typology Code available for payment source | Admitting: Occupational Therapy

## 2018-06-07 DIAGNOSIS — M25542 Pain in joints of left hand: Secondary | ICD-10-CM

## 2018-06-07 DIAGNOSIS — M6281 Muscle weakness (generalized): Secondary | ICD-10-CM

## 2018-06-07 DIAGNOSIS — M25632 Stiffness of left wrist, not elsewhere classified: Secondary | ICD-10-CM

## 2018-06-07 DIAGNOSIS — M25532 Pain in left wrist: Secondary | ICD-10-CM

## 2018-06-07 DIAGNOSIS — M25642 Stiffness of left hand, not elsewhere classified: Secondary | ICD-10-CM

## 2018-06-07 NOTE — Patient Instructions (Signed)
Wall Push-Up    With feet and hands shoulder-width apart, lean into wall, then push away from wall. Repeat 10____ times. Do _3___ sessions per day.   STANDING: Weight on Hands    Rest palms on table. Shift weight from side to side. Then walk feet away from body, and slowly rock body forward. Keep elbow straight. Hold _10__ seconds. __5_ reps per set, _3__ sets per day, Place wedge under hand if wrist range of motion is limited.   All Fours Shoulder Flexion / Extension    Place hands and knees shoulder-width apart, rock back and sit on legs, then rock forward over hands and forearms. Hands over yoga blocks or upside down bowls Repeat _5___ times. Do __3__ sessions per day.

## 2018-06-07 NOTE — Therapy (Signed)
[facility] [address] [place], [place], [address] Phone: [phone]   Fax:  [phone]  Occupational Therapy Treatment  Patient Details  Name: Karen Michael MRN: [id number] Date of Birth: 1976-07-02 Referring Provider: Dr. Griffin Basil   Encounter Date: 06/07/2018  OT End of Session - 06/07/18 1001    Visit Number  13    Number of Visits  17    Date for OT Re-Evaluation  06/15/18    Authorization Type  SELF PAY, however pt now approved 100% financial assistance through [employer]    OT Start Time  0930    OT Stop Time  1015    OT Time Calculation (min)  45 min    Activity Tolerance  Patient tolerated treatment well;Patient limited by pain    Behavior During Therapy  [facility] for tasks assessed/performed       Past Medical History:  Diagnosis Date  . Anxiety   . Asthma    last used inhaler this summer  . Distal radius fracture, left   . GERD (gastroesophageal reflux disease)    OTC as needed  . MRSA infection approx 6 years ago during breat feeding unsure which breast    Past Surgical History:  Procedure Laterality Date  . OPEN REDUCTION INTERNAL FIXATION (ORIF) DISTAL RADIAL FRACTURE Left 02/15/2018   Procedure: OPEN REDUCTION INTERNAL FIXATION (ORIF) LEFT DISTAL RADIAL FRACTURE;  Surgeon: Hiram Gash, MD;  Location: [facility];  Service: Orthopedics;  Laterality: Left;  . WISDOM TOOTH EXTRACTION      There were no vitals filed for this visit.  Subjective Assessment - 06/07/18 0933    Subjective   I've only been able to tolerate 1 minute at a time with those finger elastic splints    Pertinent History  ORIF Lt distal radius fx 02/15/18.  diagnosed with CPRS per pt report    Limitations  none at this time    Currently in Pain?  Yes    Pain Score  7     Pain Location  -- All fingers    Pain Orientation  Left    Pain Descriptors / Indicators  Tightness    Pain Type  Acute pain    Pain Onset  More  than a month ago    Pain Frequency  Intermittent    Aggravating Factors   bending fingers    Pain Relieving Factors  rest                   OT Treatments/Exercises (OP) - 06/07/18 0001      Wrist Exercises   Other wrist exercises  Pt shown wt bearing ex's for wrist stretch and UB strengthening as pt wants to return to yoga and pilates classes. Pt performed modified wt bearing over table w/ anterior wt shifts holding 10 sec. x 5 reps, modified push ups at wall x 10 reps, and modified quadraped to downward dog w/ hands over bowls x 3 reps. (See pt instructions for details) Pt educated in use of yoga blocks.     Other wrist exercises  Wrist flexion and extension x 10 reps each, 2 sets with 3 lb. weight      Hand Exercises   Other Hand Exercises  Gripper set at level 2 resistance to pick up blocks Lt hand for sustained grip strength and finger flexion w/ pain, frequent short rest breaks required     LUE Fluidotherapy   Number Minutes Fluidotherapy  10 Minutes  LUE Fluidotherapy Location  Hand;Wrist    Comments  after weight bearing ex's             OT Education - 06/07/18 0959    Education provided  Yes    Education Details  Weight bearing HEP for wrist stretch/strengthening    Person(s) Educated  Patient    Methods  Explanation;Demonstration;Handout    Comprehension  Verbalized understanding;Returned demonstration       OT Short Term Goals - 04/15/18 0926      OT SHORT TERM GOAL #1   Title  Independent with initial HEP     Time  4    Period  Weeks    Status  Achieved 03/24/18      OT SHORT TERM GOAL #2   Title  Lt Wrist ROM to be 25 degrees in flexion and extension    Baseline  See Eval & 04/06/18 note    Time  4    Period  Weeks    Status  Achieved      OT SHORT TERM GOAL #3   Title  Pt to make full fist Lt hand    Baseline  03/24/18 approx 75% & L full fist 04/06/18    Time  4    Period  Weeks    Status  Achieved      OT SHORT TERM GOAL #4   Title   Pt to id edema and pain management strategies for Lt wrist and hand    Time  4    Period  Weeks    Status  Achieved        OT Long Term Goals - 05/13/18 1013      OT LONG TERM GOAL #1   Title  Independent with updated HEP    Time  8    Period  Weeks    Status  Achieved      OT LONG TERM GOAL #2   Title  Pt to demo 45 degrees or greater wrist flexion and extension Lt wrist    Time  8    Period  Weeks    Status  Achieved 05/13/18: Lt wrist flex = 60*, ext = 52*      OT LONG TERM GOAL #3   Title  Pt to demo 45 lbs grip strength Lt hand to assist in opening jars    Time  8    Period  Weeks    Status  Achieved 05/13/18: 48 lbs      OT LONG TERM GOAL #4   Title  Pt to return to using Lt hand as assist for all bilateral tasks    Time  8    Period  Weeks    Status  Achieved      OT LONG TERM GOAL #5   Title  Lt wrist extension to 60* for functional tasks    Baseline  52* at renewal period    Time  4    Period  Weeks    Status  New    Target Date  06/15/18      Long Term Additional Goals   Additional Long Term Goals  Yes      OT LONG TERM GOAL #6   Title  Grip strength Lt hand to be 55 lbs to open tight jars    Time  4    Period  Weeks    Status  New      OT LONG TERM GOAL #7  Title  Pt to improve lateral and 3 tip pinch Lt hand by 3 lbs or greater to assist in opening medicine bottles and smaller jars    Time  4    Period  Weeks    Status  New            Plan - 06/07/18 1001    Clinical Impression Statement  Pt gradually progressing towards goals but limited by pain in fingers (and wrist w/ wt bearing activities.     Occupational Profile and client history currently impacting functional performance  No significant PMH but current limitations preventing pt from doing IADLS and work tasks    Occupational performance deficits (Please refer to evaluation for details):  IADL's;Work;Leisure;Social Participation    Rehab Potential  Good    Current  Impairments/barriers affecting progress:  pain, edema, stiffness    OT Frequency  2x / week    OT Duration  4 weeks    OT Treatment/Interventions  Self-care/ADL training;Moist Heat;Fluidtherapy;DME and/or AE instruction;Splinting;Therapeutic activities;Compression bandaging;Therapeutic exercise;Ultrasound;Cryotherapy;Passive range of motion;Electrical Stimulation;Paraffin;Patient/family education;Manual Therapy    Plan  anticipate d/c by end of next week. Continue progress towards remaining goals    Consulted and Agree with Plan of Care  Patient       Patient will benefit from skilled therapeutic intervention in order to improve the following deficits and impairments:  Decreased coordination, Decreased range of motion, Impaired flexibility, Increased edema, Impaired sensation, Decreased activity tolerance, Impaired UE functional use, Pain, Decreased strength  Visit Diagnosis: Stiffness of left wrist, not elsewhere classified  Stiffness of left hand, not elsewhere classified  Pain in joints of left hand  Pain in left wrist  Muscle weakness (generalized)    Problem List Patient Active Problem List   Diagnosis Date Noted  . SVD (spontaneous vaginal delivery) 10/14/2011  . Asthma   . HYPERTENSION 04/23/2008  . BRONCHITIS, ACUTE 04/03/2008  . HYPERLIPIDEMIA 11/18/2007  . DEPRESSION 11/18/2007  . ALLERGIC RHINITIS 11/18/2007  . ASTHMA 11/18/2007  . GERD 11/18/2007  . SPRAIN&STRAIN OTH SPEC SITES SHOULDER&UPPER ARM 11/18/2007    Carey Bullocks, OTR/L 06/07/2018, 10:09 AM  [facility] [address] [place], [place], [address] Phone: [phone]   Fax:  [phone]  Name: Karen Michael MRN: [id number] Date of Birth: Dec 03, 1976

## 2018-06-10 ENCOUNTER — Ambulatory Visit: Payer: No Typology Code available for payment source | Admitting: Occupational Therapy

## 2018-06-10 DIAGNOSIS — M25642 Stiffness of left hand, not elsewhere classified: Secondary | ICD-10-CM

## 2018-06-10 DIAGNOSIS — M25542 Pain in joints of left hand: Secondary | ICD-10-CM

## 2018-06-10 DIAGNOSIS — M25632 Stiffness of left wrist, not elsewhere classified: Secondary | ICD-10-CM

## 2018-06-10 DIAGNOSIS — M25532 Pain in left wrist: Secondary | ICD-10-CM

## 2018-06-10 DIAGNOSIS — M6281 Muscle weakness (generalized): Secondary | ICD-10-CM

## 2018-06-10 NOTE — Therapy (Signed)
[facility] [address] [place], [place], [address] Phone: [phone]   Fax:  [phone]  Occupational Therapy Treatment  Patient Details  Name: Karen Michael MRN: [id number] Date of Birth: 1976-02-20 Referring Provider: Dr. Griffin Basil   Encounter Date: 06/10/2018  OT End of Session - 06/10/18 1142    Visit Number  14    Number of Visits  17    Date for OT Re-Evaluation  06/15/18    Authorization Type  SELF PAY, however pt now approved 100% financial assistance through [employer]    OT Start Time  [phone]    OT Stop Time  1020    OT Time Calculation (min)  43 min    Activity Tolerance  Patient tolerated treatment well;Patient limited by pain    Behavior During Therapy  [facility] for tasks assessed/performed       Past Medical History:  Diagnosis Date  . Anxiety   . Asthma    last used inhaler this summer  . Distal radius fracture, left   . GERD (gastroesophageal reflux disease)    OTC as needed  . MRSA infection approx 6 years ago during breat feeding unsure which breast    Past Surgical History:  Procedure Laterality Date  . OPEN REDUCTION INTERNAL FIXATION (ORIF) DISTAL RADIAL FRACTURE Left 02/15/2018   Procedure: OPEN REDUCTION INTERNAL FIXATION (ORIF) LEFT DISTAL RADIAL FRACTURE;  Surgeon: Hiram Gash, MD;  Location: [facility];  Service: Orthopedics;  Laterality: Left;  . WISDOM TOOTH EXTRACTION      There were no vitals filed for this visit.  Subjective Assessment - 06/10/18 0949    Subjective   I'm doing about the same. The fingers feel bruised    Pertinent History  ORIF Lt distal radius fx 02/15/18.  diagnosed with CPRS per pt report    Limitations  none at this time    Currently in Pain?  Yes    Pain Score  7     Pain Location  -- all fingers    Pain Orientation  Left    Pain Descriptors / Indicators  Tightness    Pain Type  Acute pain    Pain Onset  More than a month ago    Pain Frequency   Intermittent    Aggravating Factors   bending fingers    Pain Relieving Factors  rest                   OT Treatments/Exercises (OP) - 06/10/18 0001      Exercises   Exercises  -- UBE x 5 min. Level 10 resistance for UB conditioning. Walking w/ 2 lb. Weight in LUE w/ large arm swings approx. 300+ ft      Wrist Exercises   Other wrist exercises  wrist flex/ext x 10 reps each with 4 lb. weight, followed by weighted stretch x 5 reps each way, holding 10 sec. Therapist recommended only using 4 lb. weight for weighted stretches, but continue to use 3 lb. weight for strengthening ex's      Hand Exercises   Other Hand Exercises  Clothespin activity for pinch strength: yellow to black resistance to place and retrieve clothespins on antenna      LUE Fluidotherapy   Number Minutes Fluidotherapy  10 Minutes    LUE Fluidotherapy Location  Hand;Wrist    Comments  at beginning of session for stiffness/pain  OT Short Term Goals - 04/15/18 0926      OT SHORT TERM GOAL #1   Title  Independent with initial HEP     Time  4    Period  Weeks    Status  Achieved 03/24/18      OT SHORT TERM GOAL #2   Title  Lt Wrist ROM to be 25 degrees in flexion and extension    Baseline  See Eval & 04/06/18 note    Time  4    Period  Weeks    Status  Achieved      OT SHORT TERM GOAL #3   Title  Pt to make full fist Lt hand    Baseline  03/24/18 approx 75% & L full fist 04/06/18    Time  4    Period  Weeks    Status  Achieved      OT SHORT TERM GOAL #4   Title  Pt to id edema and pain management strategies for Lt wrist and hand    Time  4    Period  Weeks    Status  Achieved        OT Long Term Goals - 05/13/18 1013      OT LONG TERM GOAL #1   Title  Independent with updated HEP    Time  8    Period  Weeks    Status  Achieved      OT LONG TERM GOAL #2   Title  Pt to demo 45 degrees or greater wrist flexion and extension Lt wrist    Time  8    Period  Weeks     Status  Achieved 05/13/18: Lt wrist flex = 60*, ext = 52*      OT LONG TERM GOAL #3   Title  Pt to demo 45 lbs grip strength Lt hand to assist in opening jars    Time  8    Period  Weeks    Status  Achieved 05/13/18: 48 lbs      OT LONG TERM GOAL #4   Title  Pt to return to using Lt hand as assist for all bilateral tasks    Time  8    Period  Weeks    Status  Achieved      OT LONG TERM GOAL #5   Title  Lt wrist extension to 60* for functional tasks    Baseline  52* at renewal period    Time  4    Period  Weeks    Status  New    Target Date  06/15/18      Long Term Additional Goals   Additional Long Term Goals  Yes      OT LONG TERM GOAL #6   Title  Grip strength Lt hand to be 55 lbs to open tight jars    Time  4    Period  Weeks    Status  New      OT LONG TERM GOAL #7   Title  Pt to improve lateral and 3 tip pinch Lt hand by 3 lbs or greater to assist in opening medicine bottles and smaller jars    Time  4    Period  Weeks    Status  New            Plan - 06/10/18 1142    Clinical Impression Statement  Pt gradually progressing towards goals but limited by pain in fingers (  and wrist w/ wt bearing activities.     Occupational Profile and client history currently impacting functional performance  No significant PMH but current limitations preventing pt from doing IADLS and work tasks    Occupational performance deficits (Please refer to evaluation for details):  IADL's;Work;Leisure;Social Participation    Rehab Potential  Good    Current Impairments/barriers affecting progress:  pain, edema, stiffness    OT Frequency  2x / week    OT Duration  4 weeks    OT Treatment/Interventions  Self-care/ADL training;Moist Heat;Fluidtherapy;DME and/or AE instruction;Splinting;Therapeutic activities;Compression bandaging;Therapeutic exercise;Ultrasound;Cryotherapy;Passive range of motion;Electrical Stimulation;Paraffin;Patient/family education;Manual Therapy    Plan  continue  progress towards goals, d/c next week    Consulted and Agree with Plan of Care  Patient       Patient will benefit from skilled therapeutic intervention in order to improve the following deficits and impairments:  Decreased coordination, Decreased range of motion, Impaired flexibility, Increased edema, Impaired sensation, Decreased activity tolerance, Impaired UE functional use, Pain, Decreased strength  Visit Diagnosis: Stiffness of left wrist, not elsewhere classified  Stiffness of left hand, not elsewhere classified  Pain in joints of left hand  Pain in left wrist  Muscle weakness (generalized)    Problem List Patient Active Problem List   Diagnosis Date Noted  . SVD (spontaneous vaginal delivery) 10/14/2011  . Asthma   . HYPERTENSION 04/23/2008  . BRONCHITIS, ACUTE 04/03/2008  . HYPERLIPIDEMIA 11/18/2007  . DEPRESSION 11/18/2007  . ALLERGIC RHINITIS 11/18/2007  . ASTHMA 11/18/2007  . GERD 11/18/2007  . SPRAIN&STRAIN OTH [facility] SITES SHOULDER&UPPER ARM 11/18/2007    Carey Bullocks, OTR/L 06/10/2018, 11:43 AM  [facility] [address] [place], [place], [address] Phone: [phone]   Fax:  [phone]  Name: Delania Ferg MRN: [id number] Date of Birth: 11/18/1976

## 2018-06-14 ENCOUNTER — Ambulatory Visit: Payer: No Typology Code available for payment source | Admitting: Occupational Therapy

## 2018-06-16 ENCOUNTER — Ambulatory Visit: Payer: No Typology Code available for payment source | Attending: Orthopaedic Surgery | Admitting: Occupational Therapy

## 2018-06-16 DIAGNOSIS — M6281 Muscle weakness (generalized): Secondary | ICD-10-CM | POA: Insufficient documentation

## 2018-06-16 DIAGNOSIS — M25642 Stiffness of left hand, not elsewhere classified: Secondary | ICD-10-CM | POA: Insufficient documentation

## 2018-06-16 DIAGNOSIS — M25542 Pain in joints of left hand: Secondary | ICD-10-CM

## 2018-06-16 DIAGNOSIS — M25532 Pain in left wrist: Secondary | ICD-10-CM | POA: Insufficient documentation

## 2018-06-16 DIAGNOSIS — M25632 Stiffness of left wrist, not elsewhere classified: Secondary | ICD-10-CM

## 2018-06-16 NOTE — Therapy (Signed)
[facility] [address] [place], [place], [address] Phone: [phone]   Fax:  [phone]  Occupational Therapy Treatment  Patient Details  Name: Karen Michael MRN: [id number] Date of Birth: [birth year] Referring Provider: Dr. Griffin Basil   Encounter Date: 06/16/2018  OT End of Session - 06/16/18 1407    Visit Number  15    Number of Visits  17    Date for OT Re-Evaluation  06/15/18    Authorization Type  SELF PAY, however pt now approved 100% financial assistance through [employer]    OT Start Time  1320    OT Stop Time  1400    OT Time Calculation (min)  40 min    Activity Tolerance  Patient tolerated treatment well;Patient limited by pain    Behavior During Therapy  [facility] for tasks assessed/performed       Past Medical History:  Diagnosis Date  . Anxiety   . Asthma    last used inhaler this summer  . Distal radius fracture, left   . GERD (gastroesophageal reflux disease)    OTC as needed  . MRSA infection approx 6 years ago during breat feeding unsure which breast    Past Surgical History:  Procedure Laterality Date  . OPEN REDUCTION INTERNAL FIXATION (ORIF) DISTAL RADIAL FRACTURE Left 02/15/2018   Procedure: OPEN REDUCTION INTERNAL FIXATION (ORIF) LEFT DISTAL RADIAL FRACTURE;  Surgeon: Hiram Gash, MD;  Location: [facility];  Service: Orthopedics;  Laterality: Left;  . WISDOM TOOTH EXTRACTION      There were no vitals filed for this visit.  Subjective Assessment - 06/16/18 1330    Pertinent History  ORIF Lt distal radius fx 02/15/18.  diagnosed with CPRS per pt report    Limitations  none at this time    Currently in Pain?  Yes    Pain Score  4     Pain Location  -- all fingers Lt hand    Pain Orientation  Left    Pain Descriptors / Indicators  Tightness    Pain Type  Acute pain    Pain Onset  More than a month ago    Pain Frequency  Intermittent    Aggravating Factors   bending fingers     Pain Relieving Factors  rest, heat         [facility] OT Assessment - 06/16/18 0001      AROM   Overall AROM Comments  Lt wrist flex = 65*, ext = 63*      Hand Function   Left Hand Grip (lbs)  54 lbs    Left Hand Lateral Pinch  16 lbs    Left 3 point pinch  17 lbs               OT Treatments/Exercises (OP) - 06/16/18 0001      ADLs   ADL Comments  Assessed remaining LTG's and progress to date (see assessment and goal section). Discussed continuing ex's at home       Hand Exercises   Other Hand Exercises  Updated putty HEP to green resistance putty for gripping ex only. Pt instructed to continue to use red putty for pinching ex's. Issued green putty. Pt performed gripping x 15 reps      LUE Fluidotherapy   Number Minutes Fluidotherapy  12 Minutes    LUE Fluidotherapy Location  Hand;Wrist    Comments  at beginning of session for stiffness/pain  OT Short Term Goals - 04/15/18 0926      OT SHORT TERM GOAL #1   Title  Independent with initial HEP     Time  4    Period  Weeks    Status  Achieved 03/24/18      OT SHORT TERM GOAL #2   Title  Lt Wrist ROM to be 25 degrees in flexion and extension    Baseline  See Eval & 04/06/18 note    Time  4    Period  Weeks    Status  Achieved      OT SHORT TERM GOAL #3   Title  Pt to make full fist Lt hand    Baseline  03/24/18 approx 75% & L full fist 04/06/18    Time  4    Period  Weeks    Status  Achieved      OT SHORT TERM GOAL #4   Title  Pt to id edema and pain management strategies for Lt wrist and hand    Time  4    Period  Weeks    Status  Achieved        OT Long Term Goals - 06/16/18 1407      OT LONG TERM GOAL #1   Title  Independent with updated HEP    Time  8    Period  Weeks    Status  Achieved      OT LONG TERM GOAL #2   Title  Pt to demo 45 degrees or greater wrist flexion and extension Lt wrist    Time  8    Period  Weeks    Status  Achieved 05/13/18: Lt wrist flex = 60*, ext =  52*      OT LONG TERM GOAL #3   Title  Pt to demo 45 lbs grip strength Lt hand to assist in opening jars    Time  8    Period  Weeks    Status  Achieved 05/13/18: 48 lbs      OT LONG TERM GOAL #4   Title  Pt to return to using Lt hand as assist for all bilateral tasks    Time  8    Period  Weeks    Status  Achieved      OT LONG TERM GOAL #5   Title  Lt wrist extension to 60* for functional tasks    Baseline  52* at renewal period    Time  4    Period  Weeks    Status  Achieved 63*      OT LONG TERM GOAL #6   Title  Grip strength Lt hand to be 55 lbs to open tight jars    Time  4    Period  Weeks    Status  Achieved 54 lbs      OT LONG TERM GOAL #7   Title  Pt to improve lateral and 3 tip pinch Lt hand by 3 lbs or greater to assist in opening medicine bottles and smaller jars    Time  4    Period  Weeks    Status  Partially Met met at 3 tip pinch (17 lbs), not met at lateral pinch (16 lbs)            Plan - 06/16/18 1409    Clinical Impression Statement  Pt has met all goals except lateral pinch strength. Pt shown ex to increase strength with this.  Occupational performance deficits (Please refer to evaluation for details):  IADL's;Work;Leisure;Social Participation    Rehab Potential  Good    OT Treatment/Interventions  Self-care/ADL training;Moist Heat;Fluidtherapy;DME and/or AE instruction;Splinting;Therapeutic activities;Compression bandaging;Therapeutic exercise;Ultrasound;Cryotherapy;Passive range of motion;Electrical Stimulation;Paraffin;Patient/family education;Manual Therapy    Plan  D/C O.T.     Consulted and Agree with Plan of Care  Patient       Patient will benefit from skilled therapeutic intervention in order to improve the following deficits and impairments:  Decreased coordination, Decreased range of motion, Impaired flexibility, Increased edema, Impaired sensation, Decreased activity tolerance, Impaired UE functional use, Pain, Decreased  strength  Visit Diagnosis: Stiffness of left wrist, not elsewhere classified  Stiffness of left hand, not elsewhere classified  Pain in joints of left hand  Pain in left wrist  Muscle weakness (generalized)    Problem List Patient Active Problem List   Diagnosis Date Noted  . SVD (spontaneous vaginal delivery) 10/14/2011  . Asthma   . HYPERTENSION 04/23/2008  . BRONCHITIS, ACUTE 04/03/2008  . HYPERLIPIDEMIA 11/18/2007  . DEPRESSION 11/18/2007  . ALLERGIC RHINITIS 11/18/2007  . ASTHMA 11/18/2007  . GERD 11/18/2007  . SPRAIN&STRAIN OTH SPEC SITES SHOULDER&UPPER ARM 11/18/2007     CCUPATIONAL THERAPY DISCHARGE SUMMARY  Visits from Start of Care: 15  Current functional level related to goals / functional outcomes: SEE ABOVE   Remaining deficits: Pain in fingers Lateral pinch strength   Education / Equipment: Pt was educated in HEP's for A/ROM, P/ROM, strengthening to wrist and hand, pain management strategies, and interventions for CRPS  Plan: Patient agrees to discharge.  Patient goals were met. Patient is being discharged due to meeting the stated rehab goals.  ?????        Carey Bullocks, OTR/L 06/16/2018, 2:10 PM  [facility] [address] [place], [place], [address] Phone: [phone]   Fax:  [phone]  Name: Jessica Checketts MRN: [id number] Date of Birth: 11-Nov-1976

## 2018-06-21 ENCOUNTER — Encounter: Payer: Medicaid Other | Admitting: Occupational Therapy

## 2019-01-19 ENCOUNTER — Other Ambulatory Visit: Payer: Self-pay | Admitting: Obstetrics and Gynecology

## 2019-01-19 DIAGNOSIS — N6310 Unspecified lump in the right breast, unspecified quadrant: Secondary | ICD-10-CM

## 2019-01-26 ENCOUNTER — Other Ambulatory Visit: Payer: Self-pay | Admitting: Obstetrics and Gynecology

## 2019-01-26 ENCOUNTER — Ambulatory Visit
Admission: RE | Admit: 2019-01-26 | Discharge: 2019-01-26 | Disposition: A | Discharge status: Home / Self Care | Payer: No Typology Code available for payment source | Source: Ambulatory Visit | Attending: Obstetrics and Gynecology | Admitting: Obstetrics and Gynecology

## 2019-01-26 ENCOUNTER — Ambulatory Visit
Admission: RE | Admit: 2019-01-26 | Discharge: 2019-01-26 | Disposition: A | Discharge status: Home / Self Care | Payer: BLUE CROSS/BLUE SHIELD | Source: Ambulatory Visit | Attending: Obstetrics and Gynecology | Admitting: Obstetrics and Gynecology

## 2019-01-26 DIAGNOSIS — N6310 Unspecified lump in the right breast, unspecified quadrant: Secondary | ICD-10-CM

## 2019-01-28 ENCOUNTER — Ambulatory Visit
Admission: RE | Admit: 2019-01-28 | Discharge: 2019-01-28 | Disposition: A | Discharge status: Home / Self Care | Payer: No Typology Code available for payment source | Source: Ambulatory Visit | Attending: Obstetrics and Gynecology | Admitting: Obstetrics and Gynecology

## 2019-01-28 DIAGNOSIS — N6310 Unspecified lump in the right breast, unspecified quadrant: Secondary | ICD-10-CM

## 2019-02-07 ENCOUNTER — Encounter: Payer: Self-pay | Admitting: General Surgery

## 2019-02-08 ENCOUNTER — Other Ambulatory Visit: Payer: Self-pay | Admitting: General Surgery

## 2019-02-08 DIAGNOSIS — R928 Other abnormal and inconclusive findings on diagnostic imaging of breast: Secondary | ICD-10-CM

## 2019-02-13 HISTORY — PX: BREAST EXCISIONAL BIOPSY: SUR124

## 2019-02-21 ENCOUNTER — Encounter (HOSPITAL_BASED_OUTPATIENT_CLINIC_OR_DEPARTMENT_OTHER): Payer: Self-pay | Admitting: *Deleted

## 2019-02-21 ENCOUNTER — Other Ambulatory Visit: Payer: Self-pay

## 2019-02-27 NOTE — H&P (Signed)
Karen Michael Location: [facility] Patient #: [id number] DOB: [birth year] Single / Language: Karen Michael / Race: White Female       History of Present Illness       . This is a 43 year old woman who is accompanied by her husband today. She is referred by Dr. Willis Modena for evaluation of complex sclerosing lesion right breast, lower outer quadrant. Her only prior breast problem wasn't MRSA abscess in the left breast 6 o'clock position in 2014 requiring excision and drainage Recent imaging studies showed a small area of architectural distortion in the right breast lower outer quadrant with coarse calcifications. Image guided biopsy shows complex sclerosing lesion. Excision was recommended Ultrasound of the axilla was negative      Past history significant for anxiety and depression. BMI 37. MRSA left breast 2014. Forearm fracture. GERD      Family history reveals maternal aunt had breast cancer. Maternal cousin had breast cancer was given chemotherapy. Paternal grandmother had colon cancer. No ovarian cancer. No pancreatic cancer. No prostate cancer.      Social history reveals she is single but lives with her common-law husband for 14 years they have one child. Takes alcohol occasionally. Smokes a half a pack of cigarettes per day. 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 I told her the risk of upgrade to cancer excision was 5-9%. She does not want take a chance and requests that we excise this area. I agree that is reasonable and basically the current standard of care. She'll be scheduled for right breast lumpectomy with radioactive seed localization. I discussed the indications, details, techniques, and numerous risk of the surgery with her and her common-law husband. She is aware of the risk of bleeding, infection, cosmetic deformity, chronic pain, reoperation for complications or cancer. She  understands all these issues. All of her questions are answered. She agrees with this plan.   Past Surgical History  Oral Surgery   Diagnostic Studies History Colonoscopy  never Mammogram  within last year Pap Smear  1-5 years ago  Allergies HYDROcodone-Acetaminophen *ANALGESICS - OPIOID*  Dermatitis, Difficulty breathing. Allergies Reconciled   Medication History  Citalopram Hydrobromide (20MG  Tablet, Oral) Active. Vitamin D (Ergocalciferol) (50000UNIT Capsule, Oral) Active. Mirena (52 MG) (20MCG/24HR IUD, Intrauterine) Active. ZyrTEC (5MG  Tablet Chewable, Oral) Active. Medications Reconciled  Social History  Alcohol use  Occasional alcohol use. Caffeine use  Coffee. Illicit drug use  Prefer to discuss with provider. Tobacco use  Current every day smoker.  Family History  Alcohol Abuse  Father. Arthritis  Family Members In General. Breast Cancer  Family Members In General. Cerebrovascular Accident  Family Members In [place] Members In General. Depression  Mother. Diabetes Mellitus  Family Members In General. Hypertension  Family Members In General, Father.  Pregnancy / Birth History  Age at menarche  64 years. Contraceptive History  Intrauterine device. Gravida  1 Irregular periods  Length (months) of breastfeeding  12-24 Maternal age  10-35 Para  1  Other Problems  Asthma  Depression  Gastroesophageal Reflux Disease  High blood pressure  Hypercholesterolemia     Review of Systems  General Present- Fatigue, Night Sweats and Weight Gain. Not Present- Appetite Loss, Chills, Fever and Weight Loss. Skin Present- Dryness. Not Present- Change in Wart/Mole, Hives, Jaundice, New Lesions, Non-Healing Wounds, Rash and Ulcer. HEENT Present- Seasonal Allergies, Sinus Pain and Wears glasses/contact lenses. Not Present- Earache,  Hearing Loss, Hoarseness, Nose Bleed, Oral Ulcers, Ringing in the Ears, Sore Throat,  Visual Disturbances and Yellow Eyes. Respiratory Present- Snoring. Not Present- Bloody sputum, Chronic Cough, Difficulty Breathing and Wheezing. Breast Not Present- Breast Mass, Breast Pain, Nipple Discharge and Skin Changes. Cardiovascular Not Present- Chest Pain, Difficulty Breathing Lying Down, Leg Cramps, Palpitations, Rapid Heart Rate, Shortness of Breath and Swelling of Extremities. Gastrointestinal Not Present- Abdominal Pain, Bloating, Bloody Stool, Change in Bowel Habits, Chronic diarrhea, Constipation, Difficulty Swallowing, Excessive gas, Gets full quickly at meals, Hemorrhoids, Indigestion, Nausea, Rectal Pain and Vomiting. Female Genitourinary Not Present- Frequency, Nocturia, Painful Urination, Pelvic Pain and Urgency. Musculoskeletal Present- Back Pain and Joint Stiffness. Not Present- Joint Pain, Muscle Pain, Muscle Weakness and Swelling of Extremities. Neurological Not Present- Decreased Memory, Fainting, Headaches, Numbness, Seizures, Tingling, Tremor, Trouble walking and Weakness. Psychiatric Present- Depression. Not Present- Anxiety, Bipolar, Change in Sleep Pattern, Fearful and Frequent crying. Endocrine Not Present- Cold Intolerance, Excessive Hunger, Hair Changes, Heat Intolerance, Hot flashes and New Diabetes. Hematology Not Present- Blood Thinners, Easy Bruising, Excessive bleeding, Gland problems, HIV and Persistent Infections.  Vitals Weight: 224.2 lb Height: 65in Body Surface Area: 2.08 m Body Mass Index: 37.31 kg/m  Temp.: 97.34F(Temporal)  Pulse: 97 (Regular)  P.OX: 98% (Room air) BP: 170/102 (Sitting, Left Arm, Standard)       Physical Exam General Mental Status-Alert. General Appearance-Consistent with stated age. Hydration-Well hydrated. Voice-Normal. Note: BMI 37   Head and Neck Head-normocephalic, atraumatic with no lesions or palpable masses. Trachea-midline. Thyroid Gland Characteristics - normal size and  consistency.  Eye Eyeball - Bilateral-Extraocular movements intact. Sclera/Conjunctiva - Bilateral-No scleral icterus.  Chest and Lung Exam Chest and lung exam reveals -quiet, even and easy respiratory effort with no use of accessory muscles and on auscultation, normal breath sounds, no adventitious sounds and normal vocal resonance. Inspection Chest Wall - Normal. Back - normal.  Breast Note: Breasts are large. Old scar left breast 6 o'clock position from abscess drainage. Minimal ecchymoses right breast laterally no palpable mass in either breast. No skin changes. No axillary adenopathy.   Cardiovascular Cardiovascular examination reveals -normal heart sounds, regular rate and rhythm with no murmurs and normal pedal pulses bilaterally.  Abdomen Inspection Inspection of the abdomen reveals - No Hernias. Skin - Scar - no surgical scars. Palpation/Percussion Palpation and Percussion of the abdomen reveal - Soft, Non Tender, No Rebound tenderness, No Rigidity (guarding) and No hepatosplenomegaly. Auscultation Auscultation of the abdomen reveals - Bowel sounds normal.  Neurologic Neurologic evaluation reveals -alert and oriented x 3 with no impairment of recent or remote memory. Mental Status-Normal.  Neuropsychiatric Note: Very pleasant, appropriate. Oriented, cooperative. Mild anxiety   Musculoskeletal Normal Exam - Left-Upper Extremity Strength Normal and Lower Extremity Strength Normal. Normal Exam - Right-Upper Extremity Strength Normal and Lower Extremity Strength Normal.  Lymphatic Head & Neck  General Head & Neck Lymphatics: Bilateral - Description - Normal. Axillary  General Axillary Region: Bilateral - Description - Normal. Tenderness - Non Tender. Femoral & Inguinal  Generalized Femoral & Inguinal Lymphatics: Bilateral - Description - Normal. Tenderness - Non Tender.    Assessment & Plan   BMI 37.0-37.9, ADULT (Z68.37)  ANXIETY AND  DEPRESSION (F41.9)  HISTORY OF MRSA INFECTION (Z86.14) Impression: Left breast, 2014, incision and drainage required  ABNORMAL MAMMOGRAM OF RIGHT BREAST (R92.8)  Recent mammogram showed an area of architectural distortion in the right breast lower outer quadrant. Ultrasound of the axilla was negative Image guided biopsy shows complex sclerosing lesion  There were no cancer cells seen, but there is a small risk of cancer, perhaps 5-9%  you stated you did not want to take a chance and would like to go ahead with lumpectomy, and I agree you will be scheduled for right breast lumpectomy with radioactive seed localization I discussed indications, techniques, and risks of the surgery with you and your husband in detail  Metaline (Z80.3)    Edsel Petrin. Dalbert Batman, M.D., [facility] General and Minimally invasive Surgery Breast and Colorectal Surgery Office:   [phone] Pager:   [phone]

## 2019-02-28 ENCOUNTER — Other Ambulatory Visit: Payer: Self-pay

## 2019-02-28 ENCOUNTER — Ambulatory Visit
Admission: RE | Admit: 2019-02-28 | Discharge: 2019-02-28 | Disposition: A | Discharge status: Home / Self Care | Payer: No Typology Code available for payment source | Source: Ambulatory Visit | Attending: General Surgery | Admitting: General Surgery

## 2019-02-28 DIAGNOSIS — R928 Other abnormal and inconclusive findings on diagnostic imaging of breast: Secondary | ICD-10-CM

## 2019-02-28 NOTE — Progress Notes (Signed)
Patient given Ensure pre-surgery drink and surgical soap with instruction on use, verbalized understanding without questions.

## 2019-03-01 ENCOUNTER — Encounter (HOSPITAL_BASED_OUTPATIENT_CLINIC_OR_DEPARTMENT_OTHER): Payer: Self-pay

## 2019-03-01 ENCOUNTER — Ambulatory Visit (HOSPITAL_BASED_OUTPATIENT_CLINIC_OR_DEPARTMENT_OTHER): Payer: BLUE CROSS/BLUE SHIELD | Admitting: Anesthesiology

## 2019-03-01 ENCOUNTER — Ambulatory Visit (HOSPITAL_BASED_OUTPATIENT_CLINIC_OR_DEPARTMENT_OTHER)
Admission: RE | Admit: 2019-03-01 | Discharge: 2019-03-01 | Disposition: A | Discharge status: Home / Self Care | Payer: BLUE CROSS/BLUE SHIELD | Attending: General Surgery | Admitting: General Surgery

## 2019-03-01 ENCOUNTER — Encounter (HOSPITAL_BASED_OUTPATIENT_CLINIC_OR_DEPARTMENT_OTHER)
Admission: RE | Disposition: A | Discharge status: Home / Self Care | Payer: Self-pay | Source: Home / Self Care | Attending: General Surgery

## 2019-03-01 ENCOUNTER — Other Ambulatory Visit: Payer: Self-pay

## 2019-03-01 ENCOUNTER — Ambulatory Visit
Admission: RE | Admit: 2019-03-01 | Discharge: 2019-03-01 | Disposition: A | Discharge status: Home / Self Care | Payer: No Typology Code available for payment source | Source: Ambulatory Visit | Attending: General Surgery | Admitting: General Surgery

## 2019-03-01 DIAGNOSIS — Z803 Family history of malignant neoplasm of breast: Secondary | ICD-10-CM | POA: Insufficient documentation

## 2019-03-01 DIAGNOSIS — F329 Major depressive disorder, single episode, unspecified: Secondary | ICD-10-CM | POA: Insufficient documentation

## 2019-03-01 DIAGNOSIS — I10 Essential (primary) hypertension: Secondary | ICD-10-CM | POA: Insufficient documentation

## 2019-03-01 DIAGNOSIS — F17210 Nicotine dependence, cigarettes, uncomplicated: Secondary | ICD-10-CM | POA: Insufficient documentation

## 2019-03-01 DIAGNOSIS — J45909 Unspecified asthma, uncomplicated: Secondary | ICD-10-CM | POA: Diagnosis not present

## 2019-03-01 DIAGNOSIS — Z800 Family history of malignant neoplasm of digestive organs: Secondary | ICD-10-CM | POA: Insufficient documentation

## 2019-03-01 DIAGNOSIS — N6489 Other specified disorders of breast: Secondary | ICD-10-CM | POA: Diagnosis present

## 2019-03-01 DIAGNOSIS — R928 Other abnormal and inconclusive findings on diagnostic imaging of breast: Secondary | ICD-10-CM

## 2019-03-01 DIAGNOSIS — F419 Anxiety disorder, unspecified: Secondary | ICD-10-CM | POA: Insufficient documentation

## 2019-03-01 HISTORY — DX: Major depressive disorder, single episode, unspecified: F32.9

## 2019-03-01 HISTORY — DX: Depression, unspecified: F32.A

## 2019-03-01 HISTORY — PX: BREAST LUMPECTOMY WITH RADIOACTIVE SEED LOCALIZATION: SHX6424

## 2019-03-01 HISTORY — DX: Other abnormal and inconclusive findings on diagnostic imaging of breast: R92.8

## 2019-03-01 SURGERY — BREAST LUMPECTOMY WITH RADIOACTIVE SEED LOCALIZATION
Anesthesia: General | Site: Breast | Laterality: Right

## 2019-03-01 MED ORDER — MIDAZOLAM HCL 2 MG/2ML IJ SOLN
INTRAMUSCULAR | Status: AC
  Filled 2019-03-01: qty 2

## 2019-03-01 MED ORDER — CHLORHEXIDINE GLUCONATE CLOTH 2 % EX PADS: 6.0000 | MEDICATED_PAD | Freq: Once | CUTANEOUS | Status: DC

## 2019-03-01 MED ORDER — FENTANYL CITRATE (PF) 100 MCG/2ML IJ SOLN: 50.0000 ug | INTRAMUSCULAR | Status: DC | PRN

## 2019-03-01 MED ORDER — MEPERIDINE HCL 25 MG/ML IJ SOLN: 6.2500 mg | INTRAMUSCULAR | Status: DC | PRN

## 2019-03-01 MED ORDER — BUPIVACAINE HCL (PF) 0.5 % IJ SOLN
INTRAMUSCULAR | Status: AC
  Filled 2019-03-01: qty 30

## 2019-03-01 MED ORDER — ACETAMINOPHEN 500 MG PO TABS
ORAL_TABLET | ORAL | Status: AC
  Filled 2019-03-01: qty 2

## 2019-03-01 MED ORDER — 0.9 % SODIUM CHLORIDE (POUR BTL) OPTIME
TOPICAL | Status: DC | PRN
  Administered 2019-03-01: 1000 mL

## 2019-03-01 MED ORDER — LACTATED RINGERS IV SOLN: INTRAVENOUS | Status: DC

## 2019-03-01 MED ORDER — FENTANYL CITRATE (PF) 100 MCG/2ML IJ SOLN
INTRAMUSCULAR | Status: AC
  Filled 2019-03-01: qty 2

## 2019-03-01 MED ORDER — SCOPOLAMINE 1 MG/3DAYS TD PT72
MEDICATED_PATCH | TRANSDERMAL | Status: AC
  Filled 2019-03-01: qty 1

## 2019-03-01 MED ORDER — CELECOXIB 200 MG PO CAPS
ORAL_CAPSULE | ORAL | Status: AC
  Filled 2019-03-01: qty 1

## 2019-03-01 MED ORDER — TRAMADOL HCL 50 MG PO TABS: 50.0000 mg | ORAL_TABLET | Freq: Four times a day (QID) | ORAL | 0 refills | Status: AC | PRN

## 2019-03-01 MED ORDER — MIDAZOLAM HCL 5 MG/5ML IJ SOLN
INTRAMUSCULAR | Status: DC | PRN
  Administered 2019-03-01: 2 mg via INTRAVENOUS

## 2019-03-01 MED ORDER — CEFAZOLIN SODIUM-DEXTROSE 2-4 GM/100ML-% IV SOLN
INTRAVENOUS | Status: AC
  Filled 2019-03-01: qty 100

## 2019-03-01 MED ORDER — ACETAMINOPHEN 500 MG PO TABS
1000.0000 mg | ORAL_TABLET | ORAL | Status: AC
  Administered 2019-03-01: 1000 mg via ORAL

## 2019-03-01 MED ORDER — GABAPENTIN 300 MG PO CAPS
ORAL_CAPSULE | ORAL | Status: AC
  Filled 2019-03-01: qty 1

## 2019-03-01 MED ORDER — FENTANYL CITRATE (PF) 250 MCG/5ML IJ SOLN
INTRAMUSCULAR | Status: DC | PRN
  Administered 2019-03-01: 50 ug via INTRAVENOUS
  Administered 2019-03-01 (×2): 25 ug via INTRAVENOUS

## 2019-03-01 MED ORDER — LIDOCAINE 2% (20 MG/ML) 5 ML SYRINGE
INTRAMUSCULAR | Status: DC | PRN
  Administered 2019-03-01: 50 mg via INTRAVENOUS

## 2019-03-01 MED ORDER — PROMETHAZINE HCL 25 MG/ML IJ SOLN: 6.2500 mg | INTRAMUSCULAR | Status: DC | PRN

## 2019-03-01 MED ORDER — BUPIVACAINE-EPINEPHRINE (PF) 0.25% -1:200000 IJ SOLN
INTRAMUSCULAR | Status: AC
  Filled 2019-03-01: qty 30

## 2019-03-01 MED ORDER — CELECOXIB 200 MG PO CAPS
200.0000 mg | ORAL_CAPSULE | ORAL | Status: AC
  Administered 2019-03-01: 200 mg via ORAL

## 2019-03-01 MED ORDER — MIDAZOLAM HCL 2 MG/2ML IJ SOLN: 1.0000 mg | INTRAMUSCULAR | Status: DC | PRN

## 2019-03-01 MED ORDER — PROPOFOL 10 MG/ML IV BOLUS
INTRAVENOUS | Status: DC | PRN
  Administered 2019-03-01: 180 mg via INTRAVENOUS

## 2019-03-01 MED ORDER — BUPIVACAINE-EPINEPHRINE (PF) 0.25% -1:200000 IJ SOLN
INTRAMUSCULAR | Status: DC | PRN
  Administered 2019-03-01: 10 mL

## 2019-03-01 MED ORDER — DEXAMETHASONE SODIUM PHOSPHATE 10 MG/ML IJ SOLN
INTRAMUSCULAR | Status: DC | PRN
  Administered 2019-03-01: 4 mg via INTRAVENOUS

## 2019-03-01 MED ORDER — CEFAZOLIN SODIUM-DEXTROSE 2-4 GM/100ML-% IV SOLN
2.0000 g | INTRAVENOUS | Status: AC
  Administered 2019-03-01: 2 g via INTRAVENOUS

## 2019-03-01 MED ORDER — FENTANYL CITRATE (PF) 100 MCG/2ML IJ SOLN
25.0000 ug | INTRAMUSCULAR | Status: DC | PRN
  Administered 2019-03-01 (×2): 50 ug via INTRAVENOUS

## 2019-03-01 MED ORDER — ONDANSETRON HCL 4 MG/2ML IJ SOLN
INTRAMUSCULAR | Status: DC | PRN
  Administered 2019-03-01: 4 mg via INTRAVENOUS

## 2019-03-01 MED ORDER — PROPOFOL 10 MG/ML IV BOLUS
INTRAVENOUS | Status: AC
  Filled 2019-03-01: qty 20

## 2019-03-01 MED ORDER — DEXAMETHASONE SODIUM PHOSPHATE 10 MG/ML IJ SOLN
INTRAMUSCULAR | Status: AC
  Filled 2019-03-01: qty 1

## 2019-03-01 MED ORDER — ACETAMINOPHEN 10 MG/ML IV SOLN: 1000.0000 mg | Freq: Once | INTRAVENOUS | Status: DC | PRN

## 2019-03-01 MED ORDER — SCOPOLAMINE 1 MG/3DAYS TD PT72
1.0000 | MEDICATED_PATCH | Freq: Once | TRANSDERMAL | Status: AC | PRN
  Administered 2019-03-01: 1 via TRANSDERMAL

## 2019-03-01 MED ORDER — ACETAMINOPHEN 325 MG PO TABS: 325.0000 mg | ORAL_TABLET | Freq: Once | ORAL | Status: DC

## 2019-03-01 MED ORDER — ACETAMINOPHEN 160 MG/5ML PO SOLN: 325.0000 mg | Freq: Once | ORAL | Status: DC

## 2019-03-01 MED ORDER — LACTATED RINGERS IV SOLN
INTRAVENOUS | Status: DC
  Administered 2019-03-01 (×2): via INTRAVENOUS

## 2019-03-01 MED ORDER — GABAPENTIN 300 MG PO CAPS
300.0000 mg | ORAL_CAPSULE | ORAL | Status: AC
  Administered 2019-03-01: 300 mg via ORAL

## 2019-03-01 SURGICAL SUPPLY — 67 items
ADH SKN CLS APL DERMABOND .7 (GAUZE/BANDAGES/DRESSINGS) ×1
APL PRP STRL LF DISP 70% ISPRP (MISCELLANEOUS) ×1
APL SKNCLS STERI-STRIP NONHPOA (GAUZE/BANDAGES/DRESSINGS)
APPLIER CLIP 9.375 MED OPEN (MISCELLANEOUS)
APR CLP MED 9.3 20 MLT OPN (MISCELLANEOUS)
BENZOIN TINCTURE PRP APPL 2/3 (GAUZE/BANDAGES/DRESSINGS) IMPLANT
BINDER BREAST LRG (GAUZE/BANDAGES/DRESSINGS) IMPLANT
BINDER BREAST MEDIUM (GAUZE/BANDAGES/DRESSINGS) IMPLANT
BINDER BREAST XLRG (GAUZE/BANDAGES/DRESSINGS) IMPLANT
BINDER BREAST XXLRG (GAUZE/BANDAGES/DRESSINGS) ×2 IMPLANT
BLADE HEX COATED 2.75 (ELECTRODE) ×3 IMPLANT
BLADE SURG 10 STRL SS (BLADE) IMPLANT
BLADE SURG 15 STRL LF DISP TIS (BLADE) ×1 IMPLANT
BLADE SURG 15 STRL SS (BLADE) ×3
CANISTER SUC SOCK COL 7IN (MISCELLANEOUS) IMPLANT
CANISTER SUCT 1200ML W/VALVE (MISCELLANEOUS) ×3 IMPLANT
CHLORAPREP W/TINT 26 (MISCELLANEOUS) ×3 IMPLANT
CLIP APPLIE 9.375 MED OPEN (MISCELLANEOUS) IMPLANT
CLOSURE WOUND 1/2 X4 (GAUZE/BANDAGES/DRESSINGS)
COVER BACK TABLE 60X90IN (DRAPES) ×3 IMPLANT
COVER MAYO STAND STRL (DRAPES) ×3 IMPLANT
COVER PROBE W GEL 5X96 (DRAPES) ×3 IMPLANT
COVER WAND RF STERILE (DRAPES) IMPLANT
DECANTER SPIKE VIAL GLASS SM (MISCELLANEOUS) IMPLANT
DERMABOND ADVANCED (GAUZE/BANDAGES/DRESSINGS) ×2
DERMABOND ADVANCED .7 DNX12 (GAUZE/BANDAGES/DRESSINGS) ×1 IMPLANT
DRAPE LAPAROSCOPIC ABDOMINAL (DRAPES) ×3 IMPLANT
DRAPE UTILITY XL STRL (DRAPES) ×3 IMPLANT
DRSG PAD ABDOMINAL 8X10 ST (GAUZE/BANDAGES/DRESSINGS) ×3 IMPLANT
ELECT REM PT RETURN 9FT ADLT (ELECTROSURGICAL) ×3
ELECTRODE REM PT RTRN 9FT ADLT (ELECTROSURGICAL) ×1 IMPLANT
GAUZE SPONGE 4X4 12PLY STRL LF (GAUZE/BANDAGES/DRESSINGS) ×3 IMPLANT
GLOVE BIOGEL PI IND STRL 6.5 (GLOVE) IMPLANT
GLOVE BIOGEL PI INDICATOR 6.5 (GLOVE) ×2
GLOVE EUDERMIC 7 POWDERFREE (GLOVE) ×3 IMPLANT
GLOVE SURG SS PI 6.5 STRL IVOR (GLOVE) ×2 IMPLANT
GOWN STRL REUS W/ TWL LRG LVL3 (GOWN DISPOSABLE) ×1 IMPLANT
GOWN STRL REUS W/ TWL XL LVL3 (GOWN DISPOSABLE) ×1 IMPLANT
GOWN STRL REUS W/TWL LRG LVL3 (GOWN DISPOSABLE) ×3
GOWN STRL REUS W/TWL XL LVL3 (GOWN DISPOSABLE) ×3
ILLUMINATOR WAVEGUIDE N/F (MISCELLANEOUS) IMPLANT
KIT MARKER MARGIN INK (KITS) ×3 IMPLANT
LIGHT WAVEGUIDE WIDE FLAT (MISCELLANEOUS) IMPLANT
NDL HYPO 25X1 1.5 SAFETY (NEEDLE) ×1 IMPLANT
NEEDLE HYPO 25X1 1.5 SAFETY (NEEDLE) ×3 IMPLANT
NS IRRIG 1000ML POUR BTL (IV SOLUTION) ×3 IMPLANT
PACK BASIN DAY SURGERY FS (CUSTOM PROCEDURE TRAY) ×3 IMPLANT
PENCIL BUTTON HOLSTER BLD 10FT (ELECTRODE) ×3 IMPLANT
SHEET MEDIUM DRAPE 40X70 STRL (DRAPES) ×2 IMPLANT
SLEEVE SCD COMPRESS KNEE MED (MISCELLANEOUS) ×3 IMPLANT
SPONGE LAP 18X18 RF (DISPOSABLE) IMPLANT
SPONGE LAP 4X18 RFD (DISPOSABLE) ×3 IMPLANT
STRIP CLOSURE SKIN 1/2X4 (GAUZE/BANDAGES/DRESSINGS) IMPLANT
SUT ETHILON 3 0 FSL (SUTURE) IMPLANT
SUT MNCRL AB 4-0 PS2 18 (SUTURE) ×3 IMPLANT
SUT SILK 2 0 SH (SUTURE) ×3 IMPLANT
SUT VIC AB 2-0 CT1 27 (SUTURE)
SUT VIC AB 2-0 CT1 TAPERPNT 27 (SUTURE) IMPLANT
SUT VIC AB 3-0 SH 27 (SUTURE)
SUT VIC AB 3-0 SH 27X BRD (SUTURE) IMPLANT
SUT VICRYL 3-0 CR8 SH (SUTURE) ×3 IMPLANT
SYR 10ML LL (SYRINGE) ×3 IMPLANT
TOWEL GREEN STERILE FF (TOWEL DISPOSABLE) ×3 IMPLANT
TRAY FAXITRON CT DISP (TRAY / TRAY PROCEDURE) ×3 IMPLANT
TUBE CONNECTING 20'X1/4 (TUBING) ×1
TUBE CONNECTING 20X1/4 (TUBING) ×2 IMPLANT
YANKAUER SUCT BULB TIP NO VENT (SUCTIONS) ×3 IMPLANT

## 2019-03-01 NOTE — Op Note (Signed)
Patient Name:           Karen Michael   Date of Surgery:        03/01/2019  Pre op Diagnosis:      Complex sclerosing lesion right breast  Post op Diagnosis:    Same  Procedure:                 Right breast lumpectomy with radioactive seed localization and margin assessment  Surgeon:                     Edsel Petrin. Dalbert Batman, M.D., FACS  Assistant:                      OR staff  Operative Indications:          . This is a 43 year old woman who is brought to the operating room for right breast lumpectomy. She is referred by Dr. Willis Modena for evaluation of complex sclerosing lesion right breast, lower outer quadrant. Her only prior breast problem wasn't MRSA abscess in the left breast 6 o'clock position in 2014 requiring excision and drainage Recent imaging studies showed a small area of architectural distortion in the right breast lower outer quadrant with coarse calcifications. Image guided biopsy shows complex sclerosing lesion. Excision was recommended   Ultrasound of the axilla was negative      Family history reveals maternal aunt had breast cancer. Maternal cousin had breast cancer was given chemotherapy. Paternal grandmother had colon cancer. No ovarian cancer. No pancreatic cancer. No prostate cancer.     We talked about this for a long time. I told her the risk of upgrade to cancer excision was 5-9%. She does not want take a chance and requests that we excise this area. I agree that is reasonable and basically the current standard of care. She'll be scheduled for right breast lumpectomy with radioactive seed localization.  Operative Findings:       The original titanium marker clip and the radioactive seed were close to one another in the right breast, lower outer quadrant, 8:30 position, about 4 cm from the areolar margin.  The specimen mammogram looked good and contained the seed and the marker clip in the relative center of the specimen.  Procedure in Detail:           Following the induction of general LMA anesthesia the patient's right breast was prepped and draped in a sterile fashion.  Intravenous antibiotics were given.  Surgical timeout was performed.  0.25% Marcaine with epinephrine was used as local infiltration anesthetic.     I found the radioactive seed in the right breast, 8:30 position.  Curvilinear incision was planned with a marking pen and then made with a knife.  Lumpectomy was performed using neoprobe and electrocautery.  The specimen was removed and marked with silk sutures and a 6 color ink kit to orient the pathologist.  The specimen mammogram looked good as described above.  The specimen was sent to the lab where the seed was retrieved.  Hemostasis was excellent and achieved with  electrocautery.  The wound was irrigated and the lumpectomy walls were marked with 5 metal marker clips.  The lumpectomy cavity was closed in layers with interrupted 3-0 Vicryl sutures and the skin closed with a running subcuticular 4-0 Monocryl and Dermabond.  Breast binder was placed and the patient taken to PACU in stable condition.  EBL 15 cc.  Counts correct.  Complications none.  Addendum: I logged onto the PMP aware website and reviewed her prescription medication history.     Edsel Petrin. Dalbert Batman, M.D., FACS General and Minimally Invasive Surgery Breast and Colorectal Surgery  03/01/2019 8:58 AM

## 2019-03-01 NOTE — Anesthesia Postprocedure Evaluation (Signed)
Anesthesia Post Note  Patient: Karen Michael  Procedure(s) Performed: RIGHT BREAST LUMPECTOMY WITH RADIOACTIVE SEED LOCALIZATION (Right Breast)     Patient location during evaluation: PACU Anesthesia Type: General Level of consciousness: awake and alert Pain management: pain level controlled Vital Signs Assessment: post-procedure vital signs reviewed and stable Respiratory status: spontaneous breathing, nonlabored ventilation, respiratory function stable and patient connected to nasal cannula oxygen Cardiovascular status: blood pressure returned to baseline and stable Postop Assessment: no apparent nausea or vomiting Anesthetic complications: no    Last Vitals:  Vitals:   03/01/19 0930 03/01/19 0945  BP: 130/77 136/76  Pulse: 82 78  Resp: 12 18  Temp:  36.6 C  SpO2: 94% 97%    Last Pain:  Vitals:   03/01/19 0945  TempSrc:   PainSc: 3                  Effie Berkshire

## 2019-03-01 NOTE — Anesthesia Preprocedure Evaluation (Signed)
Anesthesia Evaluation  Patient identified by MRN, date of birth, ID band Patient awake    Reviewed: Allergy & Precautions, NPO status , Patient's Chart, lab work & pertinent test results  Airway Mallampati: II  TM Distance: >3 FB Neck ROM: Full    Dental  (+) Teeth Intact, Dental Advisory Given   Pulmonary asthma , Current Smoker,    breath sounds clear to auscultation       Cardiovascular hypertension,  Rhythm:Regular Rate:Normal     Neuro/Psych Anxiety Depression    GI/Hepatic Neg liver ROS, GERD  ,  Endo/Other  negative endocrine ROS  Renal/GU negative Renal ROS     Musculoskeletal negative musculoskeletal ROS (+)   Abdominal Normal abdominal exam  (+)   Peds  Hematology negative hematology ROS (+)   Anesthesia Other Findings   Reproductive/Obstetrics                             Lab Results  Component Value Date   WBC 9.1 10/14/2011   HGB 12.3 10/14/2011   HCT 36.3 10/14/2011   MCV 87.3 10/14/2011   PLT 245 10/14/2011   Lab Results  Component Value Date   CREATININE 0.7 06/03/2007   BUN 5 (L) 06/03/2007   NA 140 06/03/2007   K 4.2 06/03/2007   CL 112 06/03/2007   CO2 26 06/03/2007   No results found for: INR, PROTIME   Anesthesia Physical Anesthesia Plan  ASA: II  Anesthesia Plan: General   Post-op Pain Management:    Induction: Intravenous  PONV Risk Score and Plan: 3 and Ondansetron, Dexamethasone, Midazolam and Scopolamine patch - Pre-op  Airway Management Planned: LMA  Additional Equipment: None  Intra-op Plan:   Post-operative Plan: Extubation in OR  Informed Consent: I have reviewed the patients History and Physical, chart, labs and discussed the procedure including the risks, benefits and alternatives for the proposed anesthesia with the patient or authorized representative who has indicated his/her understanding and acceptance.     Dental advisory  given  Plan Discussed with: CRNA  Anesthesia Plan Comments:         Anesthesia Quick Evaluation

## 2019-03-01 NOTE — Anesthesia Procedure Notes (Signed)
Procedure Name: LMA Insertion Date/Time: 03/01/2019 8:01 AM Performed by: Myna Bright, CRNA Pre-anesthesia Checklist: Patient identified, Emergency Drugs available, Suction available and Patient being monitored Patient Re-evaluated:Patient Re-evaluated prior to induction Oxygen Delivery Method: Circle system utilized Preoxygenation: Pre-oxygenation with 100% oxygen Induction Type: IV induction Ventilation: Mask ventilation without difficulty LMA: LMA inserted LMA Size: 4.0 Tube type: Oral Number of attempts: 1 Placement Confirmation: positive ETCO2 and breath sounds checked- equal and bilateral Tube secured with: Tape Dental Injury: Teeth and Oropharynx as per pre-operative assessment

## 2019-03-01 NOTE — Discharge Instructions (Signed)
Post Anesthesia Home Care Instructions  NO IBUPROFEN OR TYLENOL UNTIL AFTER 115PM 03/01/2019.  Activity: Get plenty of rest for the remainder of the day. A responsible individual must stay with you for 24 hours following the procedure.  For the next 24 hours, DO NOT: -Drive a car -Paediatric nurse -Drink alcoholic beverages -Take any medication unless instructed by your physician -Make any legal decisions or sign important papers.  Meals: Start with liquid foods such as gelatin or soup. Progress to regular foods as tolerated. Avoid greasy, spicy, heavy foods. If nausea and/or vomiting occur, drink only clear liquids until the nausea and/or vomiting subsides. Call your physician if vomiting continues.  Special Instructions/Symptoms: Your throat may feel dry or sore from the anesthesia or the breathing tube placed in your throat during surgery. If this causes discomfort, gargle with warm salt water. The discomfort should disappear within 24 hours.  If you had a scopolamine patch placed behind your ear for the management of post- operative nausea and/or vomiting:  1. The medication in the patch is effective for 72 hours, after which it should be removed.  Wrap patch in a tissue and discard in the trash. Wash hands thoroughly with soap and water. 2. You may remove the patch earlier than 72 hours if you experience unpleasant side effects which may include dry mouth, dizziness or visual disturbances. 3. Avoid touching the patch. Wash your hands with soap and water after contact with the patch.    [place] Office Phone Number [phone]  BREAST BIOPSY/ PARTIAL MASTECTOMY: POST OP INSTRUCTIONS  Always review your discharge instruction sheet given to you by the facility where your surgery was performed.  IF YOU HAVE DISABILITY OR FAMILY LEAVE FORMS, YOU MUST BRING THEM TO THE OFFICE FOR PROCESSING.  DO NOT GIVE THEM TO YOUR DOCTOR.  1. A prescription for pain  medication may be given to you upon discharge.  Take your pain medication as prescribed, if needed.  If narcotic pain medicine is not needed, then you may take acetaminophen (Tylenol) or ibuprofen (Advil) as needed. 2. Take your usually prescribed medications unless otherwise directed 3. If you need a refill on your pain medication, please contact your pharmacy.  They will contact our office to request authorization.  Prescriptions will not be filled after 5pm or on week-ends. 4. You should eat very light the first 24 hours after surgery, such as soup, crackers, pudding, etc.  Resume your normal diet the day after surgery. 5. Most patients will experience some swelling and bruising in the breast.  Ice packs and a good support bra will help.  Swelling and bruising can take several days to resolve.  6. It is common to experience some constipation if taking pain medication after surgery.  Increasing fluid intake and taking a stool softener will usually help or prevent this problem from occurring.  A mild laxative (Milk of Magnesia or Miralax) should be taken according to package directions if there are no bowel movements after 48 hours. 7. Unless discharge instructions indicate otherwise, you may remove your bandages 24-48 hours after surgery, and you may shower at that time.  You may have steri-strips (small skin tapes) in place directly over the incision.  These strips should be left on the skin for 7-10 days.  If your surgeon used skin glue on the incision, you may shower in 24 hours.  The glue will flake off over the next 2-3 weeks.  Any sutures or staples will  be removed at the office during your follow-up visit. 8. ACTIVITIES:  You may resume regular daily activities (gradually increasing) beginning the next day.  Wearing a good support bra or sports bra minimizes pain and swelling.  You may have sexual intercourse when it is comfortable. a. You may drive when you no longer are taking prescription pain  medication, you can comfortably wear a seatbelt, and you can safely maneuver your car and apply brakes. b. RETURN TO WORK:  ______________________________________________________________________________________ 9. You should see your doctor in the office for a follow-up appointment approximately two weeks after your surgery.  Your doctors nurse will typically make your follow-up appointment when she calls you with your pathology report.  Expect your pathology report 2-3 business days after your surgery.  You may call to check if you do not hear from [place] after three days. 10. OTHER INSTRUCTIONS: _______________________________________________________________________________________________ _____________________________________________________________________________________________________________________________________ _____________________________________________________________________________________________________________________________________ _____________________________________________________________________________________________________________________________________  WHEN TO CALL YOUR DOCTOR: 1. Fever over 101.0 2. Nausea and/or vomiting. 3. Extreme swelling or bruising. 4. Continued bleeding from incision. 5. Increased pain, redness, or drainage from the incision.  The clinic staff is available to answer your questions during regular business hours.  Please dont hesitate to call and ask to speak to one of the nurses for clinical concerns.  If you have a medical emergency, go to the nearest emergency room or call 911.  A surgeon from [facility] is always on call at the hospital.  For further questions, please visit [email or web address]        For pain control, follow the instructions listed below for the use of ibuprofen and Tylenol. A prescription for tramadol  was called to your pharmacy, just in case you need it        Managing Your  Pain After Surgery Without Opioids    Thank you for participating in our program to help patients manage their pain after surgery without opioids. This is part of our effort to provide you with the best care possible, without exposing you or your family to the risk that opioids pose.  What pain can I expect after surgery? You can expect to have some pain after surgery. This is normal. The pain is typically worse the day after surgery, and quickly begins to get better. Many studies have found that many patients are able to manage their pain after surgery with Over-the-Counter (OTC) medications such as Tylenol and Motrin. If you have a condition that does not allow you to take Tylenol or Motrin, notify your surgical team.  How will I manage my pain? The best strategy for controlling your pain after surgery is around the clock pain control with Tylenol (acetaminophen) and Motrin (ibuprofen or Advil). Alternating these medications with each other allows you to maximize your pain control. In addition to Tylenol and Motrin, you can use heating pads or ice packs on your incisions to help reduce your pain.  How will I alternate your regular strength over-the-counter pain medication? You will take a dose of pain medication every three hours. ; Start by taking 650 mg of Tylenol (2 pills of 325 mg) ; 3 hours later take 600 mg of Motrin (3 pills of 200 mg) ; 3 hours after taking the Motrin take 650 mg of Tylenol ; 3 hours after that take 600 mg of Motrin.   - 1 -  See example - if your first dose of Tylenol is at 12:00 PM   12:00 PM Tylenol 650 mg (2 pills of 325 mg)  3:00 PM Motrin 600 mg (3 pills of 200 mg)  6:00 PM Tylenol 650 mg (2 pills of 325 mg)  9:00 PM Motrin 600 mg (3 pills of 200 mg)  Continue alternating every 3 hours   We recommend that you follow this schedule around-the-clock for at least 3 days after surgery, or until you feel that it is no longer needed. Use the table on the last  page of this handout to keep track of the medications you are taking. Important: Do not take more than 3000mg  of Tylenol or 3200mg  of Motrin in a 24-hour period. Do not take ibuprofen/Motrin if you have a history of bleeding stomach ulcers, severe kidney disease, &/or actively taking a blood thinner  What if I still have pain? If you have pain that is not controlled with the over-the-counter pain medications (Tylenol and Motrin or Advil) you might have what we call breakthrough pain. You will receive a prescription for a small amount of an opioid pain medication such as Oxycodone, Tramadol, or Tylenol with Codeine. Use these opioid pills in the first 24 hours after surgery if you have breakthrough pain. Do not take more than 1 pill every 4-6 hours.  If you still have uncontrolled pain after using all opioid pills, don't hesitate to call our staff using the number provided. We will help make sure you are managing your pain in the best way possible, and if necessary, we can provide a prescription for additional pain medication.   Day 1    Time  Name of Medication Number of pills taken  Amount of Acetaminophen  Pain Level   Comments  AM PM       AM PM       AM PM       AM PM       AM PM       AM PM       AM PM       AM PM       Total Daily amount of Acetaminophen Do not take more than  3,000 mg per day      Day 2    Time  Name of Medication Number of pills taken  Amount of Acetaminophen  Pain Level   Comments  AM PM       AM PM       AM PM       AM PM       AM PM       AM PM       AM PM       AM PM       Total Daily amount of Acetaminophen Do not take more than  3,000 mg per day      Day 3    Time  Name of Medication Number of pills taken  Amount of Acetaminophen  Pain Level   Comments  AM PM       AM PM       AM PM       AM PM          AM PM       AM PM       AM PM       AM PM       Total Daily amount of Acetaminophen Do not take more than   3,000 mg per day      Day 4    Time  Name of Medication Number of pills  taken  Amount of Acetaminophen  Pain Level   Comments  AM PM       AM PM       AM PM       AM PM       AM PM       AM PM       AM PM       AM PM       Total Daily amount of Acetaminophen Do not take more than  3,000 mg per day      Day 5    Time  Name of Medication Number of pills taken  Amount of Acetaminophen  Pain Level   Comments  AM PM       AM PM       AM PM       AM PM       AM PM       AM PM       AM PM       AM PM       Total Daily amount of Acetaminophen Do not take more than  3,000 mg per day       Day 6    Time  Name of Medication Number of pills taken  Amount of Acetaminophen  Pain Level  Comments  AM PM       AM PM       AM PM       AM PM       AM PM       AM PM       AM PM       AM PM       Total Daily amount of Acetaminophen Do not take more than  3,000 mg per day      Day 7    Time  Name of Medication Number of pills taken  Amount of Acetaminophen  Pain Level   Comments  AM PM       AM PM       AM PM       AM PM       AM PM       AM PM       AM PM       AM PM       Total Daily amount of Acetaminophen Do not take more than  3,000 mg per day        For additional information about how and where to safely dispose of unused opioid medications - [employer]  Disclaimer: This document contains information and/or instructional materials adapted from [place] for the typical patient with your condition. It does not replace medical advice from your health care provider because your experience may differ from that of the typical patient. Talk to your health care provider if you have any questions about this document, your condition or your treatment plan. Adapted from [place]

## 2019-03-01 NOTE — Interval H&P Note (Signed)
History and Physical Interval Note:  03/01/2019 6:52 AM  Karen Michael  has presented today for surgery, with the diagnosis of COMPLEX SCLEROSING LESION RIGHT BREAST.  The various methods of treatment have been discussed with the patient and family. After consideration of risks, benefits and other options for treatment, the patient has consented to  Procedure(s): RIGHT BREAST LUMPECTOMY WITH RADIOACTIVE SEED LOCALIZATION (Right) as a surgical intervention.  The patient's history has been reviewed, patient examined, no change in status, stable for surgery.  I have reviewed the patient's chart and labs.  Questions were answered to the patient's satisfaction.     Adin Hector

## 2019-03-01 NOTE — Transfer of Care (Signed)
Immediate Anesthesia Transfer of Care Note  Patient: Karen Michael  Procedure(s) Performed: RIGHT BREAST LUMPECTOMY WITH RADIOACTIVE SEED LOCALIZATION (Right Breast)  Patient Location: PACU  Anesthesia Type:General  Level of Consciousness: awake, sedated and patient cooperative  Airway & Oxygen Therapy: Patient Spontanous Breathing  Post-op Assessment: Report given to RN and Post -op Vital signs reviewed and stable  Post vital signs: Reviewed and stable  Last Vitals:  Vitals Value Taken Time  BP 121/92 03/01/2019  9:01 AM  Temp    Pulse 84 03/01/2019  9:03 AM  Resp 17 03/01/2019  9:03 AM  SpO2 98 % 03/01/2019  9:03 AM  Vitals shown include unvalidated device data.  Last Pain:  Vitals:   03/01/19 0712  TempSrc: Oral  PainSc: 0-No pain         Complications: No apparent anesthesia complications

## 2019-03-02 ENCOUNTER — Encounter (HOSPITAL_BASED_OUTPATIENT_CLINIC_OR_DEPARTMENT_OTHER): Payer: Self-pay | Admitting: General Surgery

## 2019-03-03 NOTE — Progress Notes (Signed)
Inform patient of Pathology report,. Final breast pathology report shows benign complex sclerosing lesion There is no evidence of cancer This is obviously good news and nothing further needs to be done Left me know that you contacted her  Dalbert Batman

## 2019-05-25 IMAGING — MG MM CLIP PLACEMENT
6 series · 6 of 14 positions shown · non-contrast
Comparison: Previous exam(s).

CLINICAL DATA: Confirmation clip placement after stereotactic
tomosynthesis core needle biopsy of a possible developing asymmetry
associated with calcifications in the LOWER OUTER QUADRANT of the
RIGHT breast.

EXAM:
DIAGNOSTIC RIGHT MAMMOGRAM POST STEREOTACTIC BIOPSY

[R CC]
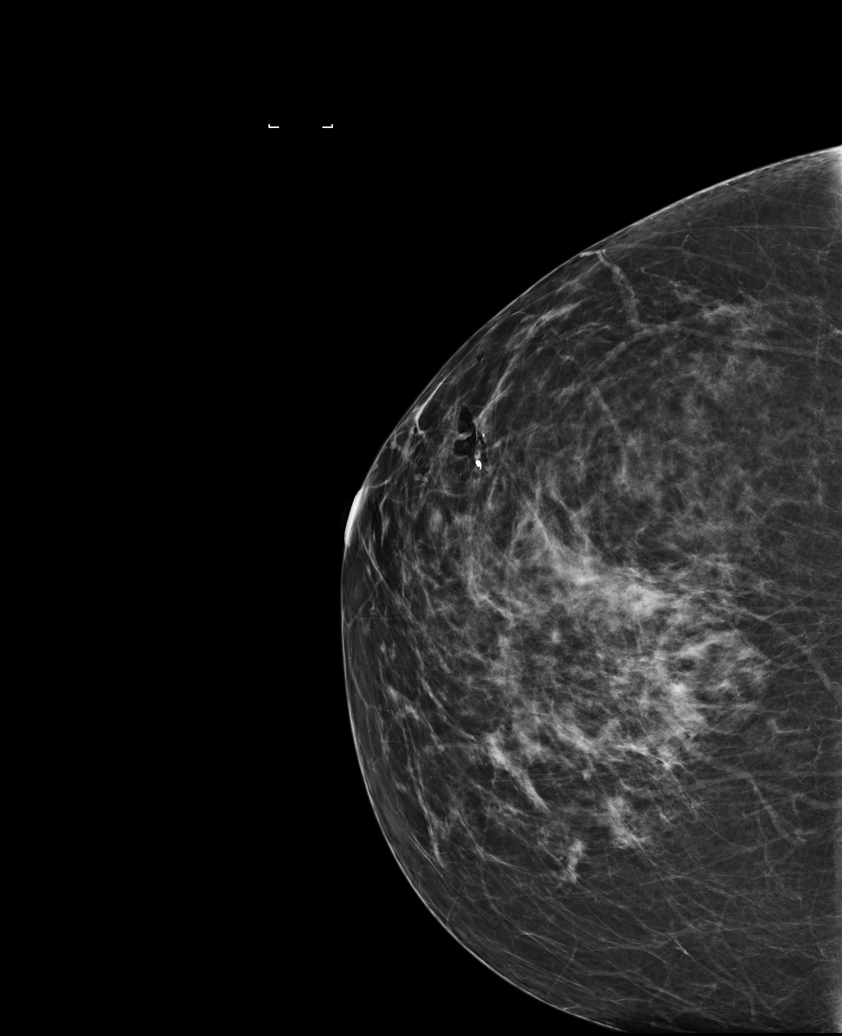

[R CC synth-2D]
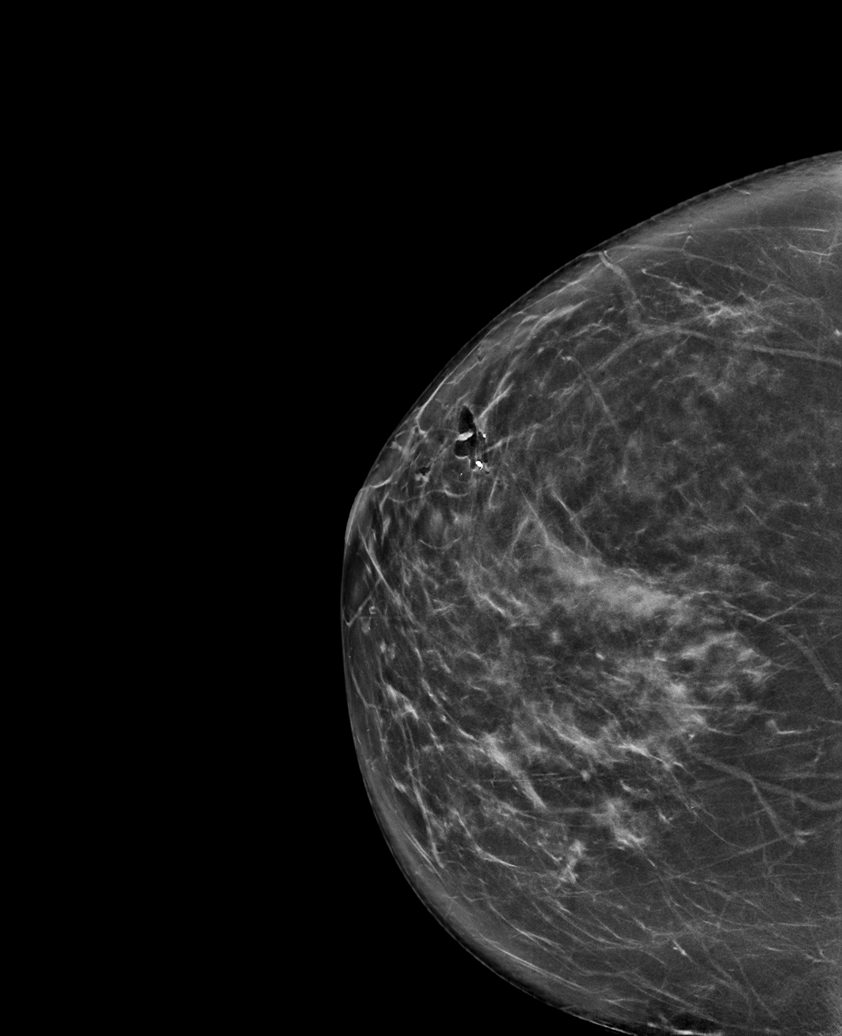

[R ML synth-2D]
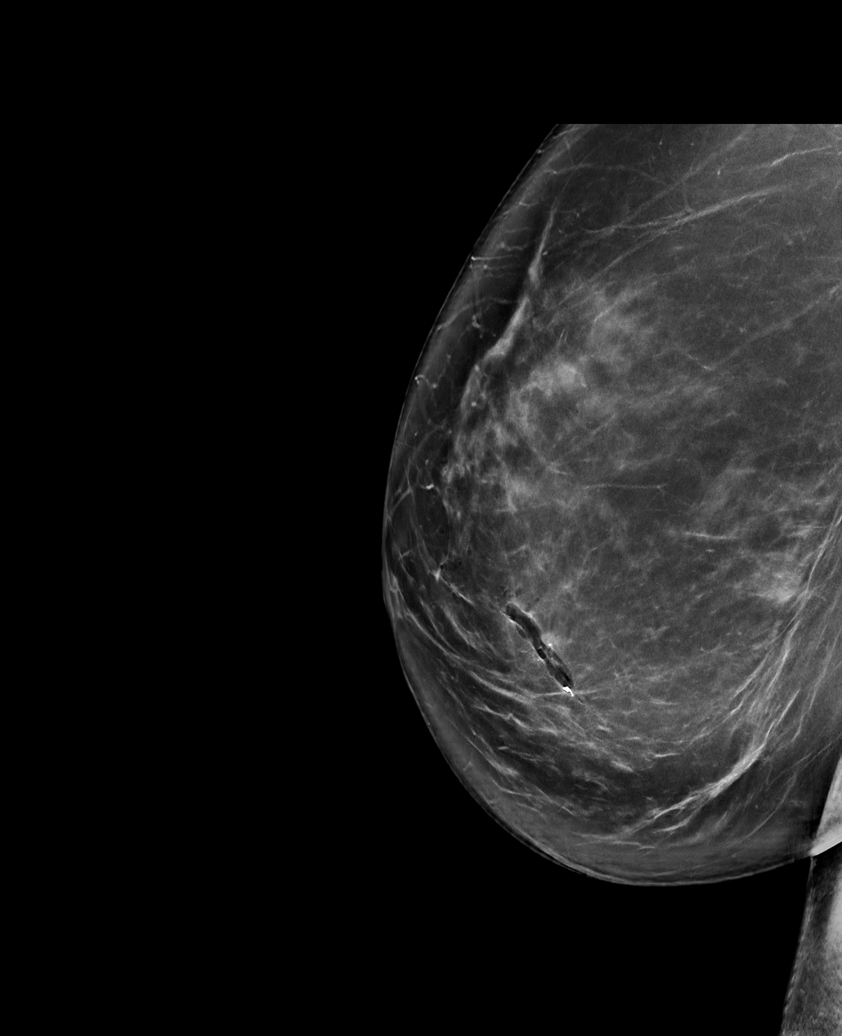

[R ML]
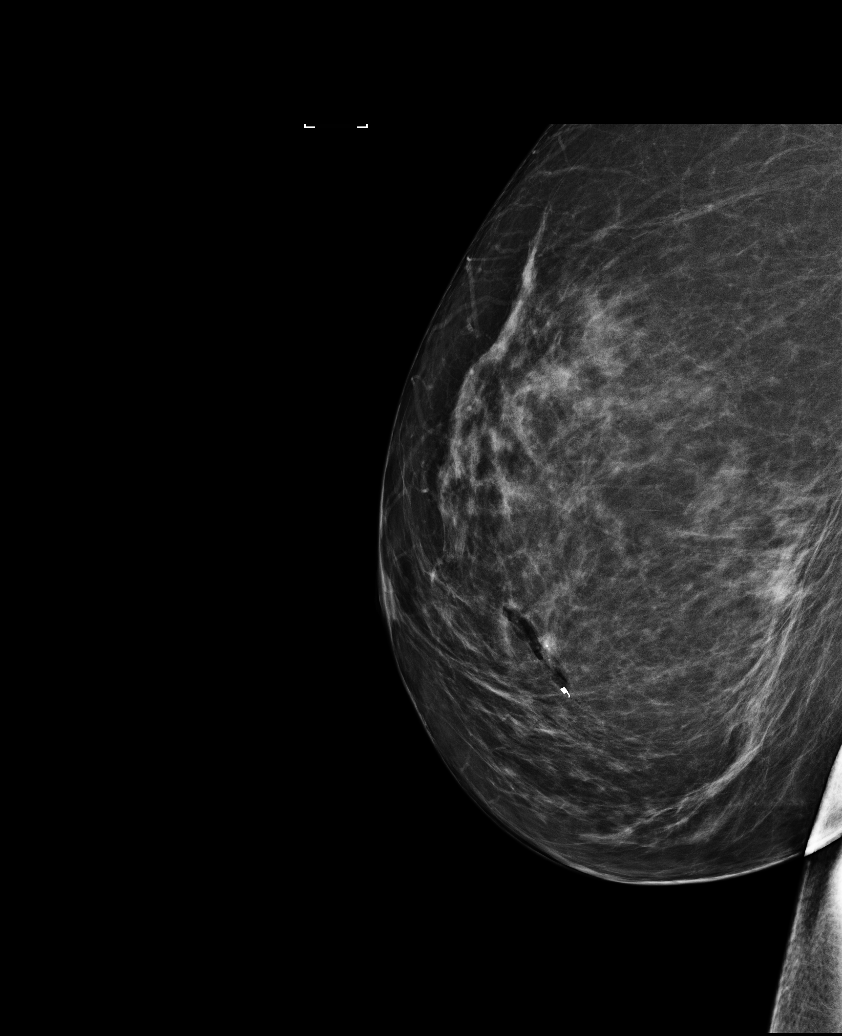

[R ML tomo · tomo slice 53/105.0]
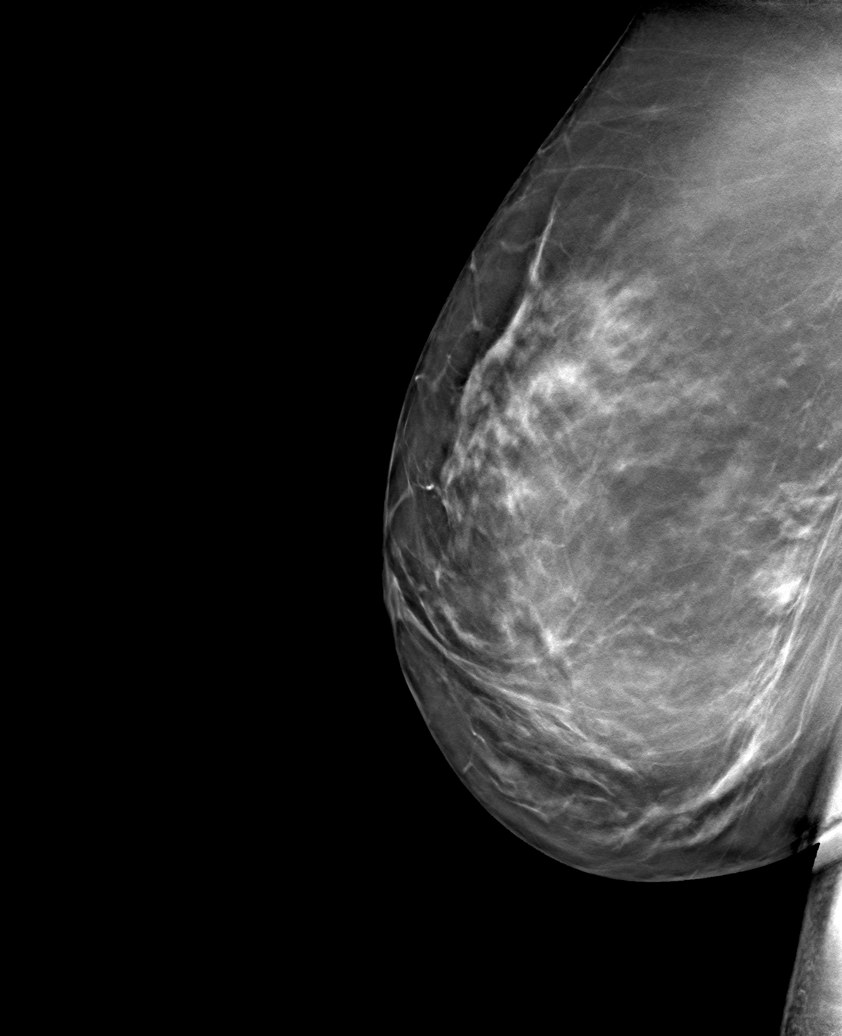

[R CC tomo · tomo slice 43/85.0]
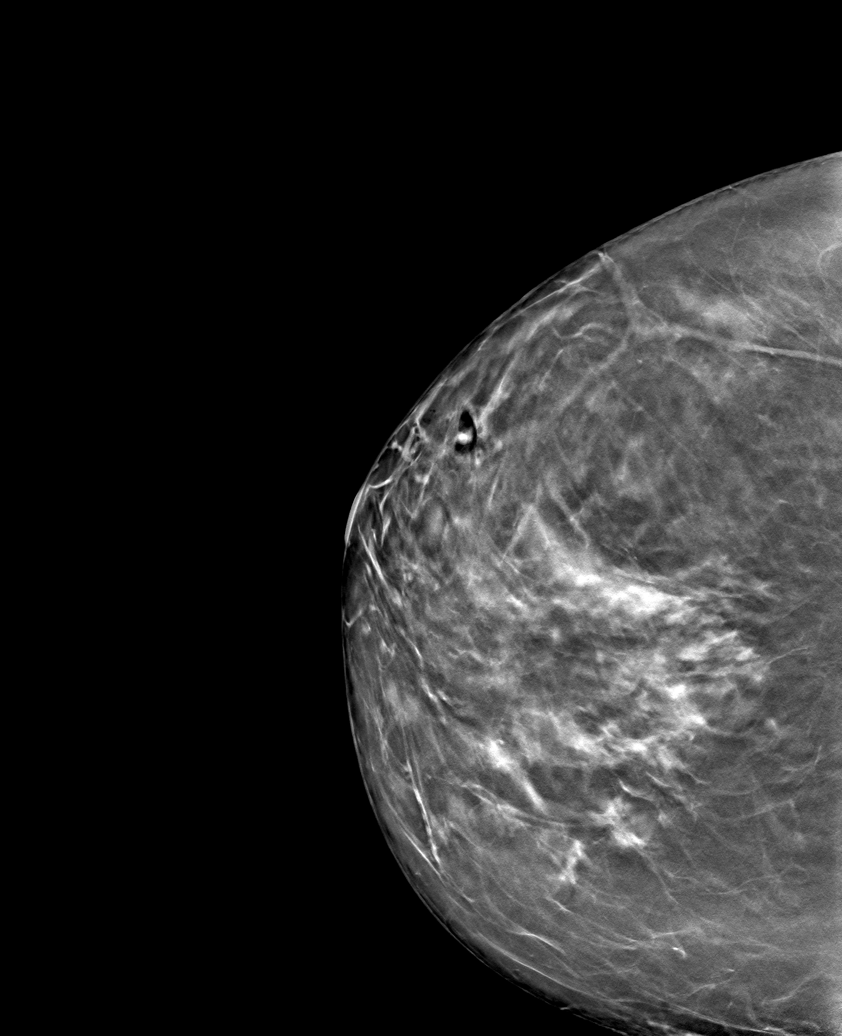

[6 of 14 positions shown; findings below may reference images not displayed]

FINDINGS: Mammographic images were obtained following stereotactic
tomosynthesis guided biopsy of a possible developing asymmetry with
calcifications in the LOWER OUTER quadrant of the RIGHT breast. The
coil shaped tissue marker clip migrated approximately 6 mm MEDIAL
and 7-8 mm INFERIOR to the biopsied asymmetry and calcifications. A
solitary residual calcification is present at the biopsy site.

Expected post biopsy changes are present without evidence of
hematoma.
IMPRESSION: Slight 6 mm MEDIAL and 7-8 mm INFERIOR migration of the coil shaped
tissue marker clip relative to the biopsied focal asymmetry and
calcifications.

Final Assessment: Post Procedure Mammograms for Marker Placement

## 2019-06-26 IMAGING — DX BREAST SURGICAL SPECIMEN
1 series · 2 of 2 positions shown · non-contrast
Comparison: Previous exam(s).

CLINICAL DATA: Post right breast excision.

EXAM:
SPECIMEN RADIOGRAPH OF THE RIGHT BREAST

[Series 2: specimen digital x-ray, derived · right · 2 of 2 slices shown]
[im 1/2]
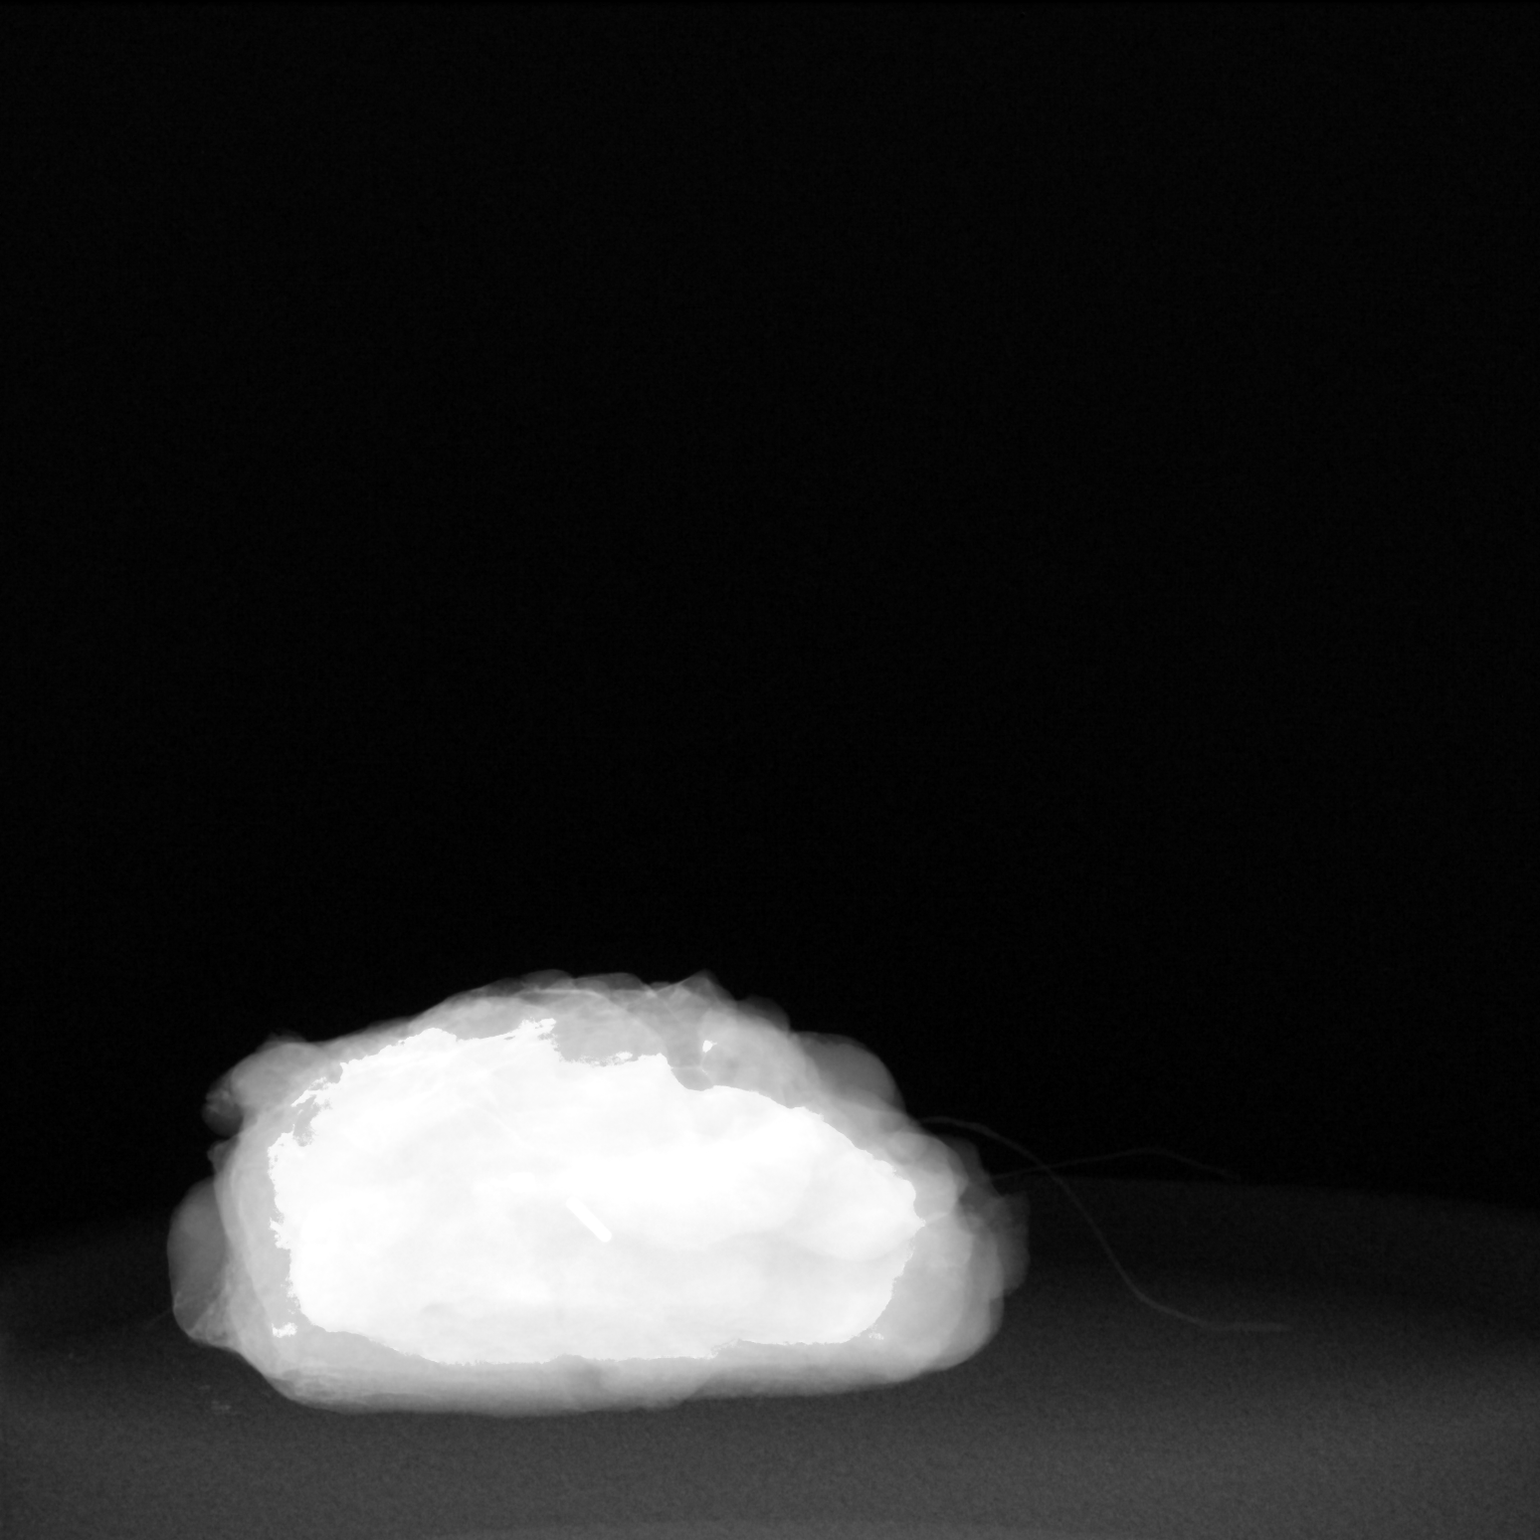
[im 2/2]
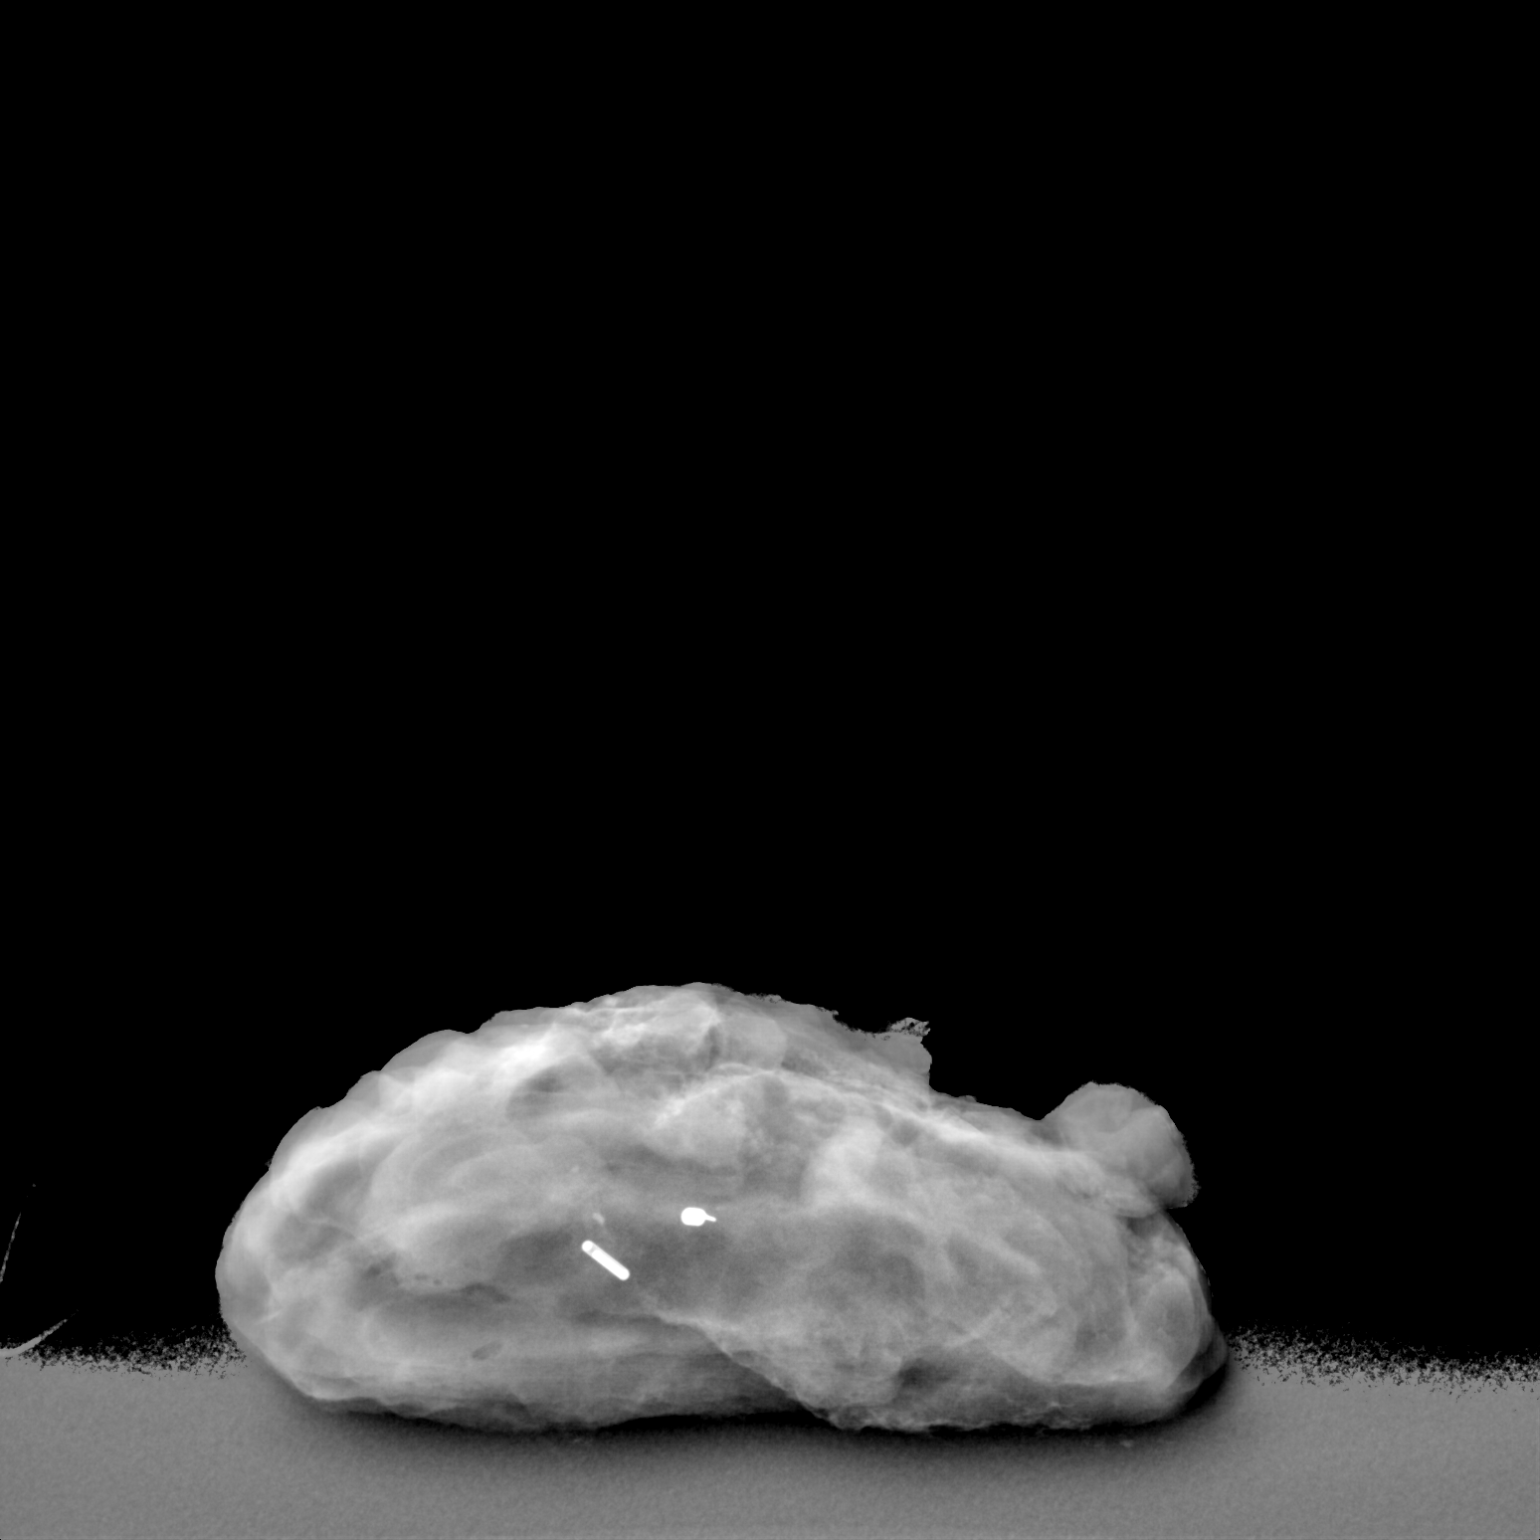

[2 of 2 positions shown; findings below may reference images not displayed]

FINDINGS: Status post excision of the right breast. The radioactive seed and
biopsy marker clip are present, completely intact, and were marked
for pathology.
IMPRESSION: Specimen radiograph of the right breast.

## 2019-10-21 ENCOUNTER — Other Ambulatory Visit: Payer: Self-pay

## 2019-10-21 DIAGNOSIS — Z20822 Contact with and (suspected) exposure to covid-19: Secondary | ICD-10-CM

## 2019-10-22 LAB — NOVEL CORONAVIRUS, NAA: SARS-CoV-2, NAA: NOT DETECTED

## 2020-01-17 ENCOUNTER — Other Ambulatory Visit: Payer: Self-pay | Admitting: Obstetrics and Gynecology

## 2020-01-17 DIAGNOSIS — Z1231 Encounter for screening mammogram for malignant neoplasm of breast: Secondary | ICD-10-CM

## 2020-01-18 ENCOUNTER — Other Ambulatory Visit: Payer: Self-pay | Admitting: Obstetrics and Gynecology

## 2020-01-18 DIAGNOSIS — N649 Disorder of breast, unspecified: Secondary | ICD-10-CM

## 2020-02-28 ENCOUNTER — Ambulatory Visit
Admission: RE | Admit: 2020-02-28 | Discharge: 2020-02-28 | Disposition: A | Discharge status: Home / Self Care | Payer: BC Managed Care – PPO | Source: Ambulatory Visit | Attending: Obstetrics and Gynecology | Admitting: Obstetrics and Gynecology

## 2020-02-28 ENCOUNTER — Ambulatory Visit: Payer: No Typology Code available for payment source

## 2020-02-28 ENCOUNTER — Other Ambulatory Visit: Payer: Self-pay

## 2020-02-28 DIAGNOSIS — N649 Disorder of breast, unspecified: Secondary | ICD-10-CM

## 2021-02-28 ENCOUNTER — Ambulatory Visit
Admission: RE | Admit: 2021-02-28 | Discharge: 2021-02-28 | Disposition: A | Discharge status: Home / Self Care | Payer: BC Managed Care – PPO | Source: Ambulatory Visit | Attending: Obstetrics and Gynecology | Admitting: Obstetrics and Gynecology

## 2021-02-28 ENCOUNTER — Other Ambulatory Visit: Payer: Self-pay

## 2021-02-28 DIAGNOSIS — Z1231 Encounter for screening mammogram for malignant neoplasm of breast: Secondary | ICD-10-CM

## 2021-05-09 ENCOUNTER — Encounter: Payer: Self-pay | Admitting: Gastroenterology

## 2021-07-26 ENCOUNTER — Ambulatory Visit (AMBULATORY_SURGERY_CENTER): Payer: BC Managed Care – PPO | Admitting: *Deleted

## 2021-07-26 ENCOUNTER — Other Ambulatory Visit: Payer: Self-pay

## 2021-07-26 VITALS — Ht 65.0 in | Wt 200.0 lb

## 2021-07-26 DIAGNOSIS — Z1211 Encounter for screening for malignant neoplasm of colon: Secondary | ICD-10-CM

## 2021-07-26 MED ORDER — PLENVU 140 G PO SOLR: 1.0000 | Freq: Once | ORAL | 0 refills | Status: AC

## 2021-07-26 NOTE — Progress Notes (Signed)
Pt's previsit is done over the phone and all paperwork (prep instructions, blank consent form to just read over) sent to patient.  Pt's name and DOB verified at the beginning of the previsit.  Pt denies any difficulty with ambulating.    No trouble with anesthesia, denies being told they were difficult to intubate, or hx/fam hx of malignant hyperthermia per pt   No egg or soy allergy  No home oxygen use   emmi information given  Pt denies constipation issues  Pt informed that we do not do prior authorizations for prep  Plenvu coupon sent and code put into RX  Told to stop Phentermine 10 days to procedure (07-29-21)

## 2021-08-07 ENCOUNTER — Encounter: Payer: Self-pay | Admitting: Gastroenterology

## 2021-08-09 ENCOUNTER — Ambulatory Visit (AMBULATORY_SURGERY_CENTER): Payer: BC Managed Care – PPO | Admitting: Gastroenterology

## 2021-08-09 ENCOUNTER — Encounter: Payer: Self-pay | Admitting: Gastroenterology

## 2021-08-09 ENCOUNTER — Other Ambulatory Visit: Payer: Self-pay

## 2021-08-09 VITALS — BP 109/62 | HR 69 | Temp 97.5°F | Resp 10 | Ht 65.0 in | Wt 200.0 lb

## 2021-08-09 DIAGNOSIS — K635 Polyp of colon: Secondary | ICD-10-CM

## 2021-08-09 DIAGNOSIS — D125 Benign neoplasm of sigmoid colon: Secondary | ICD-10-CM

## 2021-08-09 DIAGNOSIS — Z1211 Encounter for screening for malignant neoplasm of colon: Secondary | ICD-10-CM | POA: Diagnosis not present

## 2021-08-09 DIAGNOSIS — D124 Benign neoplasm of descending colon: Secondary | ICD-10-CM

## 2021-08-09 MED ORDER — SODIUM CHLORIDE 0.9 % IV SOLN: 500.0000 mL | Freq: Once | INTRAVENOUS | Status: AC

## 2021-08-09 NOTE — Progress Notes (Signed)
History & Physical  Primary Care Physician:  Bernerd Limbo, MD Primary Gastroenterologist: Jerilynn Mages. Fuller Plan, MD  CHIEF COMPLAINT:  CRC screening   HPI: Karen Michael is a 45 y.o. female who presents for colonoscopy for average risk screening.  This is her first colonoscopy.  No active GI complaints.   Past Medical History:  Diagnosis Date   Abnormality of right breast on screening mammogram 03/01/2019   Anxiety    Asthma    last used inhaler this summer   Depression    Distal radius fracture, left    GERD (gastroesophageal reflux disease)    OTC as needed   MRSA (methicillin resistant staph aureus) culture positive    on breast- 2014   MRSA infection approx 6 years ago during breat feeding unsure which breast    Past Surgical History:  Procedure Laterality Date   BREAST EXCISIONAL BIOPSY Right 02/2019   complex sclerosing lesion   BREAST LUMPECTOMY WITH RADIOACTIVE SEED LOCALIZATION Right 03/01/2019   Procedure: RIGHT BREAST LUMPECTOMY WITH RADIOACTIVE SEED LOCALIZATION;  Surgeon: Fanny Skates, MD;  Location: [facility];  Service: General;  Laterality: Right;   CATARACT EXTRACTION Left    OPEN REDUCTION INTERNAL FIXATION (ORIF) DISTAL RADIAL FRACTURE Left 02/15/2018   Procedure: OPEN REDUCTION INTERNAL FIXATION (ORIF) LEFT DISTAL RADIAL FRACTURE;  Surgeon: Hiram Gash, MD;  Location: [facility];  Service: Orthopedics;  Laterality: Left;   WISDOM TOOTH EXTRACTION      Prior to Admission medications   Medication Sig Start Date End Date Taking? Authorizing Provider  ALPRAZolam (XANAX XR) 1 MG 24 hr tablet Take by mouth. 08/07/21 09/06/21 Yes [provider]  buPROPion (WELLBUTRIN XL) 150 MG 24 hr tablet Take 300 mg by mouth daily.   Yes [provider]  cetirizine (ZYRTEC) 10 MG tablet Take 10 mg by mouth daily.   Yes [provider]  citalopram (CELEXA) 20 MG tablet Take 20 mg by mouth daily.   Yes [provider]  ergocalciferol (VITAMIN D2) 1.25 MG (50000 UT) capsule Take by mouth once a week. 04/15/21 09/24/21 Yes [provider]  omeprazole (PRILOSEC) 40 MG capsule Take by mouth. Takes 20 mg daily 07/31/20  Yes [provider]  Prenatal Vit-Fe Fumarate-FA (MULTIVITAMIN-PRENATAL) 27-0.8 MG TABS tablet Take 1 tablet by mouth daily at 12 noon.   Yes [provider]  acetaminophen (TYLENOL) 500 MG tablet Take 500 mg by mouth every 6 (six) hours as needed.    [provider]  ASHWAGANDHA PO Take by mouth.    [provider]  hydrOXYzine (ATARAX/VISTARIL) 25 MG tablet Take 25 mg by mouth at bedtime. Patient not taking: No sig reported    [provider]  lansoprazole (PREVACID) 15 MG capsule Take 15 mg by mouth daily at 12 noon. 20 mg Patient not taking: Reported on 08/09/2021    [provider]  levonorgestrel (MIRENA) 20 MCG/24HR IUD 1 each by Intrauterine route once.    [provider]  phentermine 30 MG capsule daily.    [provider]    Current Outpatient Medications  Medication Sig Dispense Refill   ALPRAZolam (XANAX XR) 1 MG 24 hr tablet Take by mouth.     buPROPion (WELLBUTRIN XL) 150 MG 24 hr tablet Take 300 mg by mouth daily.     cetirizine (ZYRTEC) 10 MG tablet Take 10 mg by mouth daily.     citalopram (CELEXA) 20 MG tablet Take 20 mg by mouth daily.  ergocalciferol (VITAMIN D2) 1.25 MG (50000 UT) capsule Take by mouth once a week.     omeprazole (PRILOSEC) 40 MG capsule Take by mouth. Takes 20 mg daily     Prenatal Vit-Fe Fumarate-FA (MULTIVITAMIN-PRENATAL) 27-0.8 MG TABS tablet Take 1 tablet by mouth daily at 12 noon.     acetaminophen (TYLENOL) 500 MG tablet Take 500 mg by mouth every 6 (six) hours as needed.     ASHWAGANDHA PO Take by mouth.     hydrOXYzine (ATARAX/VISTARIL) 25 MG tablet Take 25 mg by mouth at bedtime. (Patient not taking: No sig reported)     lansoprazole (PREVACID) 15 MG  capsule Take 15 mg by mouth daily at 12 noon. 20 mg (Patient not taking: Reported on 08/09/2021)     levonorgestrel (MIRENA) 20 MCG/24HR IUD 1 each by Intrauterine route once.     phentermine 30 MG capsule daily.     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   Hydrocodone Hives 02/15/2018   Nonoxynol 9  07/26/2021   Oxycodone Nausea Only 03/01/2019    Family History  Problem Relation Age of Onset   Breast cancer Maternal Aunt    Esophageal cancer Maternal Grandmother    Colon cancer Paternal Grandmother    Breast cancer Cousin    Rectal cancer Neg Hx    Stomach cancer Neg Hx     Social History   Socioeconomic History   Marital status: Significant Other    Spouse name: Not on file   Number of children: Not on file   Years of education: Not on file   Highest education level: Not on file  Occupational History   Not on file  Tobacco Use   Smoking status: Every Day    Packs/day: 0.50    Types: Cigarettes   Smokeless tobacco: Never  Vaping Use   Vaping Use: Never used  Substance and Sexual Activity   Alcohol use: Yes    Comment: social   Drug use: No   Sexual activity: Yes    Birth control/protection: I.U.D.  Other Topics Concern   Not on file  Social History Narrative   Not on file   Social Determinants of Health   Financial Resource Strain: Not on file  Food Insecurity: Not on file  Transportation Needs: Not on file  Physical Activity: Not on file  Stress: Not on file  Social Connections: Not on file  Intimate Partner Violence: Not on file    Review of Systems:  All systems reviewed an negative except where noted in HPI.  Gen: Denies any fever, chills, sweats, anorexia, fatigue,  weakness, malaise, weight loss, and sleep disorder CV: Denies chest pain, angina, palpitations, syncope, orthopnea, PND, peripheral edema, and claudication. Resp: Denies dyspnea at rest, dyspnea with exercise, cough, sputum, wheezing, coughing up blood, and pleurisy. GI: Denies vomiting blood, jaundice, and fecal incontinence.   Denies dysphagia or odynophagia. GU : Denies urinary burning, blood in urine, urinary frequency, urinary hesitancy, nocturnal urination, and urinary incontinence. MS: Denies joint pain, limitation of movement, and swelling, stiffness, low back pain, extremity pain. Denies muscle weakness, cramps, atrophy.  Derm: Denies rash, itching, dry skin, hives, moles, warts, or unhealing ulcers.  Psych: Denies depression, anxiety,  memory loss, suicidal ideation, hallucinations, paranoia, and confusion. Heme: Denies bruising, bleeding, and enlarged lymph nodes. Neuro:  Denies any headaches, dizziness, paresthesias. Endo:  Denies any problems with DM, thyroid, adrenal function.   Physical Exam: General:  Alert, well-developed, in NAD Head:  Normocephalic and atraumatic. Eyes:  Sclera clear, no icterus.   Conjunctiva pink. Ears:  Normal auditory acuity. Mouth:  No deformity or lesions.  Neck:  Supple; no masses . Lungs:  Clear throughout to auscultation.   No wheezes, crackles, or rhonchi. No acute distress. Heart:  Regular rate and rhythm; no murmurs. Abdomen:  Soft, nondistended, nontender. No masses, hepatomegaly. No obvious masses.  Normal bowel .    Rectal:  Deferred   Msk:  Symmetrical without gross deformities.. Pulses:  Normal pulses noted. Extremities:  Without edema. Neurologic:  Alert and  oriented x4;  grossly normal neurologically. Skin:  Intact without significant lesions or rashes. Cervical Nodes:  No significant cervical adenopathy. Psych:  Alert and cooperative. Normal mood and affect.   Impression / Plan:   CRC screening, average risk.  This is her  first colonoscopy.     This patient is appropriate for endoscopic procedures in the ambulatory setting.    Pricilla Riffle. Fuller Plan  08/09/2021, 9:18 AM

## 2021-08-09 NOTE — Progress Notes (Signed)
PT taken to PACU. Monitors in place. VSS. Report given to RN. 

## 2021-08-09 NOTE — Progress Notes (Signed)
VS completed by CW.   Pt's states no medical or surgical changes since previsit or office visit.  

## 2021-08-09 NOTE — Progress Notes (Signed)
Called to room to assist during endoscopic procedure.  Patient ID and intended procedure confirmed with present staff. Received instructions for my participation in the procedure from the performing physician.  

## 2021-08-09 NOTE — Patient Instructions (Signed)
Information on polyps given to you today.  Await pathology results.  Resume previous diet and medications.  YOU HAD AN ENDOSCOPIC PROCEDURE TODAY AT THE [facility]:   Refer to the procedure report that was given to you for any specific questions about what was found during the examination.  If the procedure report does not answer your questions, please call your gastroenterologist to clarify.  If you requested that your care partner not be given the details of your procedure findings, then the procedure report has been included in a sealed envelope for you to review at your convenience later.  YOU SHOULD EXPECT: Some feelings of bloating in the abdomen. Passage of more gas than usual.  Walking can help get rid of the air that was put into your GI tract during the procedure and reduce the bloating. If you had a lower endoscopy (such as a colonoscopy or flexible sigmoidoscopy) you may notice spotting of blood in your stool or on the toilet paper. If you underwent a bowel prep for your procedure, you may not have a normal bowel movement for a few days.  Please Note:  You might notice some irritation and congestion in your nose or some drainage.  This is from the oxygen used during your procedure.  There is no need for concern and it should clear up in a day or so.  SYMPTOMS TO REPORT IMMEDIATELY:   Following lower endoscopy (colonoscopy or flexible sigmoidoscopy):  Excessive amounts of blood in the stool  Significant tenderness or worsening of abdominal pains  Swelling of the abdomen that is new, acute  Fever of 100F or higher   For urgent or emergent issues, a gastroenterologist can be reached at any hour by calling [phone]. Do not use MyChart messaging for urgent concerns.    DIET:  We do recommend a small meal at first, but then you may proceed to your regular diet.  Drink plenty of fluids but you should avoid alcoholic beverages for 24 hours.  ACTIVITY:  You should  plan to take it easy for the rest of today and you should NOT DRIVE or use heavy machinery until tomorrow (because of the sedation medicines used during the test).    FOLLOW UP: Our staff will call the number listed on your records 48-72 hours following your procedure to check on you and address any questions or concerns that you may have regarding the information given to you following your procedure. If we do not reach you, we will leave a message.  We will attempt to reach you two times.  During this call, we will ask if you have developed any symptoms of COVID 19. If you develop any symptoms (ie: fever, flu-like symptoms, shortness of breath, cough etc.) before then, please call [phone].  If you test positive for Covid 19 in the 2 weeks post procedure, please call and report this information to us.    If any biopsies were taken you will be contacted by phone or by letter within the next 1-3 weeks.  Please call us at [phone] if you have not heard about the biopsies in 3 weeks.    SIGNATURES/CONFIDENTIALITY: You and/or your care partner have signed paperwork which will be entered into your electronic medical record.  These signatures attest to the fact that that the information above on your After Visit Summary has been reviewed and is understood.  Full responsibility of the confidentiality of this discharge information lies with you and/or your care-partner. 

## 2021-08-09 NOTE — Op Note (Signed)
[name] Patient Name: Karen Michael Procedure Date: 08/09/2021 9:07 AM MRN: [id number] Endoscopist: Ladene Artist , MD Age: 45 Referring MD:  Date of Birth: 06/20/1976 Gender: Female Account #: [id number] Procedure:                Colonoscopy Indications:              Screening for colorectal malignant neoplasm Medicines:                Monitored Anesthesia Care Procedure:                Pre-Anesthesia Assessment:                           - Prior to the procedure, a History and Physical                            was performed, and patient medications and                            allergies were reviewed. The patient's tolerance of                            previous anesthesia was also reviewed. The risks                            and benefits of the procedure and the sedation                            options and risks were discussed with the patient.                            All questions were answered, and informed consent                            was obtained. Prior Anticoagulants: The patient has                            taken no previous anticoagulant or antiplatelet                            agents. ASA Grade Assessment: II - A patient with                            mild systemic disease. After reviewing the risks                            and benefits, the patient was deemed in                            satisfactory condition to undergo the procedure.                           After obtaining informed consent, the colonoscope  was passed under direct vision. Throughout the                            procedure, the patient's blood pressure, pulse, and                            oxygen saturations were monitored continuously. The                            Olympus [id number] was introduced through                            the anus and advanced to the the cecum, identified                            by  appendiceal orifice and ileocecal valve. The                            ileocecal valve, appendiceal orifice, and rectum                            were photographed. The quality of the bowel                            preparation was excellent. The colonoscopy was                            performed without difficulty. The patient tolerated                            the procedure well. Scope In: 9:24:14 AM Scope Out: 9:41:22 AM Scope Withdrawal Time: 0 hours 13 minutes 39 seconds  Total Procedure Duration: 0 hours 17 minutes 8 seconds  Findings:                 The perianal and digital rectal examinations were                            normal.                           Two sessile polyps were found in the sigmoid colon                            and descending colon. The polyps were 9 to 10 mm in                            size. These polyps were removed with a cold snare.                            Resection and retrieval were complete.                           The exam was otherwise without abnormality on  direct and retroflexion views. Complications:            No immediate complications. Estimated blood loss:                            None. Estimated Blood Loss:     Estimated blood loss: none. Impression:               - Two 9 to 10 mm polyps in the sigmoid colon and in                            the descending colon, removed with a cold snare.                            Resected and retrieved.                           - The examination was otherwise normal on direct                            and retroflexion views. Recommendation:           - Repeat colonoscopy date to be determined after                            pending pathology results are reviewed for                            surveillance based on pathology results.                           - Patient has a contact number available for                            emergencies. The signs and  symptoms of potential                            delayed complications were discussed with the                            patient. Return to normal activities tomorrow.                            Written discharge instructions were provided to the                            patient.                           - Resume previous diet.                           - Continue present medications.                           - Await pathology results. Ladene Artist, MD 08/09/2021 9:47:32 AM This report has been  signed electronically.

## 2021-08-13 ENCOUNTER — Telehealth: Payer: Self-pay | Admitting: *Deleted

## 2021-08-13 NOTE — Telephone Encounter (Signed)
  Follow up Call-  Call back number 08/09/2021  Post procedure Call Back phone  # [phone]  Permission to leave phone message Yes  Some recent data might be hidden     Patient questions:  Do you have a fever, pain , or abdominal swelling? No. Pain Score  0 *  Have you tolerated food without any problems? Yes.    Have you been able to return to your normal activities? Yes.    Do you have any questions about your discharge instructions: Diet   No. Medications  No. Follow up visit  No.  Do you have questions or concerns about your Care? Yes.  Pt is having some bloating still where " my stomach is still pretty big." She has had a normal BM and is having no pain with the bloating. I encouraged her to left [place] know if this doesn't improve.  Actions: * If pain score is 4 or above: No action needed, pain <4.  Have you developed a fever since your procedure? no  2.   Have you had an respiratory symptoms (SOB or cough) since your procedure? no  3.   Have you tested positive for COVID 19 since your procedure no  4.   Have you had any family members/close contacts diagnosed with the COVID 19 since your procedure?  no   If yes to any of these questions please route to Joylene John, RN and Joella Prince, RN

## 2021-08-23 ENCOUNTER — Encounter: Payer: Self-pay | Admitting: Gastroenterology
